# Patient Record
Sex: Male | Born: 1970 | Race: White | Hispanic: No | Marital: Married | Smoking: Never smoker
Health system: Southern US, Community
[De-identification: ages and names within clinical notes are randomized; demographics above are authoritative.]

## PROBLEM LIST (undated history)

## (undated) DIAGNOSIS — F32A Depression, unspecified: Secondary | ICD-10-CM

## (undated) DIAGNOSIS — K219 Gastro-esophageal reflux disease without esophagitis: Secondary | ICD-10-CM

## (undated) DIAGNOSIS — E119 Type 2 diabetes mellitus without complications: Secondary | ICD-10-CM

## (undated) DIAGNOSIS — F329 Major depressive disorder, single episode, unspecified: Secondary | ICD-10-CM

## (undated) DIAGNOSIS — L700 Acne vulgaris: Secondary | ICD-10-CM

## (undated) DIAGNOSIS — G4733 Obstructive sleep apnea (adult) (pediatric): Secondary | ICD-10-CM

## (undated) DIAGNOSIS — E785 Hyperlipidemia, unspecified: Secondary | ICD-10-CM

## (undated) DIAGNOSIS — I10 Essential (primary) hypertension: Secondary | ICD-10-CM

## (undated) DIAGNOSIS — F419 Anxiety disorder, unspecified: Secondary | ICD-10-CM

## (undated) HISTORY — PX: OTHER SURGICAL HISTORY: SHX169

---

## 2007-09-21 ENCOUNTER — Ambulatory Visit: Payer: Self-pay | Admitting: Internal Medicine

## 2007-10-13 ENCOUNTER — Ambulatory Visit: Payer: Self-pay | Admitting: Internal Medicine

## 2010-11-20 ENCOUNTER — Emergency Department: Payer: Self-pay | Admitting: Emergency Medicine

## 2014-01-03 ENCOUNTER — Emergency Department: Payer: Self-pay | Admitting: Student

## 2014-01-03 LAB — DRUG SCREEN, URINE
AMPHETAMINES, UR SCREEN: NEGATIVE (ref ?–1000)
BARBITURATES, UR SCREEN: NEGATIVE (ref ?–200)
BENZODIAZEPINE, UR SCRN: NEGATIVE (ref ?–200)
Cannabinoid 50 Ng, Ur ~~LOC~~: NEGATIVE (ref ?–50)
Cocaine Metabolite,Ur ~~LOC~~: NEGATIVE (ref ?–300)
MDMA (ECSTASY) UR SCREEN: NEGATIVE (ref ?–500)
METHADONE, UR SCREEN: NEGATIVE (ref ?–300)
Opiate, Ur Screen: NEGATIVE (ref ?–300)
Phencyclidine (PCP) Ur S: NEGATIVE (ref ?–25)
Tricyclic, Ur Screen: NEGATIVE (ref ?–1000)

## 2014-01-03 LAB — CBC
HCT: 46.1 % (ref 40.0–52.0)
HGB: 14.9 g/dL (ref 13.0–18.0)
MCH: 28.5 pg (ref 26.0–34.0)
MCHC: 32.3 g/dL (ref 32.0–36.0)
MCV: 88 fL (ref 80–100)
PLATELETS: 205 10*3/uL (ref 150–440)
RBC: 5.21 10*6/uL (ref 4.40–5.90)
RDW: 14.4 % (ref 11.5–14.5)
WBC: 11.8 10*3/uL — ABNORMAL HIGH (ref 3.8–10.6)

## 2014-01-03 LAB — COMPREHENSIVE METABOLIC PANEL
ALBUMIN: 4 g/dL (ref 3.4–5.0)
ANION GAP: 9 (ref 7–16)
AST: 15 U/L (ref 15–37)
Alkaline Phosphatase: 163 U/L — ABNORMAL HIGH
BUN: 6 mg/dL — ABNORMAL LOW (ref 7–18)
Bilirubin,Total: 0.5 mg/dL (ref 0.2–1.0)
CHLORIDE: 104 mmol/L (ref 98–107)
CO2: 28 mmol/L (ref 21–32)
CREATININE: 1.21 mg/dL (ref 0.60–1.30)
Calcium, Total: 8.4 mg/dL — ABNORMAL LOW (ref 8.5–10.1)
EGFR (African American): >60
EGFR (Non-African Amer.): >60
GLUCOSE: 182 mg/dL — AB (ref 65–99)
OSMOLALITY: 284 (ref 275–301)
Potassium: 3.7 mmol/L (ref 3.5–5.1)
SGPT (ALT): 26 U/L
SODIUM: 141 mmol/L (ref 136–145)
Total Protein: 8.2 g/dL (ref 6.4–8.2)

## 2014-01-03 LAB — URINALYSIS, COMPLETE
BACTERIA: NONE SEEN
BILIRUBIN, UR: NEGATIVE
Blood: NEGATIVE
Glucose,UR: NEGATIVE mg/dL (ref 0–75)
Ketone: NEGATIVE
Leukocyte Esterase: NEGATIVE
NITRITE: NEGATIVE
Ph: 6 (ref 4.5–8.0)
Protein: NEGATIVE
RBC,UR: NONE SEEN /HPF (ref 0–5)
Specific Gravity: 1.014 (ref 1.003–1.030)
Squamous Epithelial: NONE SEEN

## 2014-01-03 LAB — ETHANOL: Ethanol: <3 mg/dL

## 2014-01-03 LAB — ACETAMINOPHEN LEVEL: Acetaminophen: <2 ug/mL

## 2014-01-03 LAB — SALICYLATE LEVEL: Salicylates, Serum: <1.7 mg/dL

## 2014-06-22 NOTE — Consult Note (Signed)
PATIENT NAME:  [name] [name], [name] [name] MR#:  [id number] DATE OF BIRTH:  26-May-1970  DATE OF CONSULTATION:  01/03/2014  REFERRING PHYSICIAN:   CONSULTING PHYSICIAN:  Audery AmelJohn T. Hercules Hasler, MD  IDENTIFYING INFORMATION AND REASON FOR CONSULTATION: This is a 44 year old man with a history of depression who was referred voluntarily to come to the Emergency Room for evaluation.   CHIEF COMPLAINT: "I've been feeling worse."   HISTORY OF PRESENT ILLNESS: Information obtained from the patient and the chart as well as A face-to-face conversation I had with his outpatient provider, Dr. Maryruth BunKapur. The patient sees Dr. Maryruth BunKapur regularly for treatment of depression. His symptoms have been getting worse recently. His father died over the summer, which was a terrible blow to him emotionally. Additionally, his mother has cancer. Also, there seems to be some chronic tension in his marriage to some degree. The patient dislikes his job and feels like it is emotionally stressful as well. He has recently found himself crying more. He has less interest in going to work. Feels sleepy a lot during the day. He had made a comment earlier about how he had passive thoughts of just wishing that he would die, but he tells me very clearly that he has not had any plans or intention of doing anything to kill himself. He can articulate a desire to live for the sake of his 44-year-old daughter. Does not feel completely hopeless about the future. He is not having any psychotic symptoms. The patient is taking antidepressant medication and recently there was an addition of Abilify to that, but it is probably too early to know if that is going to be helpful. The patient is requesting that he be released and allowed to go home.   PAST PSYCHIATRIC HISTORY: No history of hospitalizations. No history of suicide attempts. He has been seeing Dr. Maryruth BunKapur for outpatient treatment for his depression. She also sees him as having a chronic anxiety disorder with social  anxiety and probably generalized anxiety as well. She notes to me also that he has been somewhat dependent on his family chronically and was very devoted to his father. The patient has no history of psychosis. No history of substance abuse.   PAST MEDICAL HISTORY: He has diabetes, high cholesterol and high blood pressure.   SOCIAL HISTORY: Married, has a 44-year-old daughter. Stress in the marriage seems to be not terrible but chronic. As mentioned, his father died this summer and his mother has cancer, both of which are very stressful for him. The patient works driving a truck and doing maintenance.   SUBSTANCE ABUSE HISTORY: No alcohol or drug use. No past history of substance abuse.   FAMILY HISTORY: Unknown.   REVIEW OF SYSTEMS: He says that he feels sad and lonely. Mood is down. Tired more than usual. Sleeps excessively. Lost interest in some activities. Denies any hallucinations. Denies any suicidal or homicidal ideation. Physically he is not reporting any physical symptoms other than being somewhat more fatigued chronically.   MENTAL STATUS EXAMINATION: A somewhat disheveled gentleman who looks his stated age, cooperative with the interview. Good eye contact. Normal psychomotor activity. Speech normal rate, tone and volume. He was tearful during part of the interview, but appropriately so and reactive. Mood is stated as being down. Thoughts appear to be lucid. No evidence of loosening of associations or delusions. Denies auditory or visual hallucinations. Denies any suicidal intent or plan. Denies any homicidal ideation. He can recall 3/3 objects immediately and 2/3 at three minutes. Long-term  memory intact. Judgment and insight intact. Normal fund of knowledge.   CURRENT MEDICATIONS: Lexapro 20 mg once a day, Janumet 500/50 one tablet once a day, atorvastatin 40 mg at night, Abilify 5 mg a day, hydroxyzine p.r.n. for anxiety, temazepam 15 mg at night as needed for sleep, nifedipine 30 mg once a  day extended release.   ALLERGIES: PENICILLIN AND SULFA DRUGS.   LABORATORY RESULTS: Blood sugar a little high at 182. Calcium low at 8.4. Alkaline phosphatase elevated at 163. Alcohol level negative. Drug screen entirely negative. CBC: Elevated white count 11.3, otherwise normal. Urinalysis normal, not showing excessive glucose. Acetaminophen and salicylates negative.   VITAL SIGNS: His blood pressure in the Emergency Room currently 173/98, respirations 17, pulse 105, temperature 98.   ASSESSMENT: A 44 year old man with major depression and chronic anxiety with exacerbation recently. He had come to the Emergency Room essentially on his own initiative because he had been feeling worse. After evaluation today, the patient states very much that he wants to go home. He does not feel comfortable with the idea of being admitted to the hospital. He is able to articulate to me as I said a clear desire to continue living and does not have any plan to harm himself. I spoke with his outpatient provider, Dr. Maryruth Bun, who says she is not concerned that he is an active threat to harm himself either. She has planned to see him in her office tomorrow morning and he is agreeable to that. The patient was counseled and given supportive therapy. No change made to medicine. The patient can be released from the Emergency Room.   DIAGNOSIS, PRINCIPAL AND PRIMARY:  AXIS I: Major depression, single, moderate.   SECONDARY DIAGNOSES: AXIS I:  1.  Social anxiety disorder.  2.  Generalized anxiety disorder.  AXIS II: Deferred.  AXIS III: Diabetes, high blood pressure, dyslipidemia. ____________________________ Audery Amel, MD jtc:sb D: 01/03/2014 13:09:08 ET T: 01/03/2014 13:20:41 ET JOB#: [id number]  cc: Audery Amel, MD, <Dictator> Audery Amel MD ELECTRONICALLY SIGNED 01/05/2014 14:57

## 2014-06-22 NOTE — Consult Note (Signed)
Brief Consult Note: Diagnosis: major depression.   Patient was seen by consultant.   Consult note dictated.   Discussed with Attending MD.   Comments: PSychiatry: PAtient seen and chart reviewed and discussed case with outpt provider. PAtient denies any suicidal ideation and wants to go home. Does not meet criteria for commitment. Will suggest discharge from the ER. Follow up Dr Maryruth BunKapur tomorrow.  Electronic Signatures: Audery Amellapacs, Juliett Eastburn T (MD)  (Signed [phone]-Nov-15 13:02)  Authored: Brief Consult Note   Last Updated: 05-Nov-15 13:02 by Audery Amellapacs, Mattew Chriswell T (MD)

## 2014-11-06 ENCOUNTER — Ambulatory Visit: Payer: BC Managed Care – PPO | Attending: Internal Medicine

## 2014-11-06 DIAGNOSIS — G4733 Obstructive sleep apnea (adult) (pediatric): Secondary | ICD-10-CM | POA: Diagnosis present

## 2014-11-28 ENCOUNTER — Ambulatory Visit: Payer: BC Managed Care – PPO | Attending: Specialist

## 2014-11-28 DIAGNOSIS — G4733 Obstructive sleep apnea (adult) (pediatric): Secondary | ICD-10-CM | POA: Insufficient documentation

## 2014-11-28 DIAGNOSIS — G4761 Periodic limb movement disorder: Secondary | ICD-10-CM | POA: Insufficient documentation

## 2015-01-22 ENCOUNTER — Emergency Department
Admission: EM | Admit: 2015-01-22 | Discharge: 2015-01-22 | Disposition: A | Discharge status: Home / Self Care | Payer: BC Managed Care – PPO | Attending: Emergency Medicine | Admitting: Emergency Medicine

## 2015-01-22 ENCOUNTER — Encounter: Payer: Self-pay | Admitting: Emergency Medicine

## 2015-01-22 DIAGNOSIS — A46 Erysipelas: Secondary | ICD-10-CM

## 2015-01-22 DIAGNOSIS — L739 Follicular disorder, unspecified: Secondary | ICD-10-CM | POA: Insufficient documentation

## 2015-01-22 DIAGNOSIS — I10 Essential (primary) hypertension: Secondary | ICD-10-CM | POA: Insufficient documentation

## 2015-01-22 DIAGNOSIS — E119 Type 2 diabetes mellitus without complications: Secondary | ICD-10-CM | POA: Insufficient documentation

## 2015-01-22 DIAGNOSIS — Z880 Allergy status to penicillin: Secondary | ICD-10-CM | POA: Diagnosis not present

## 2015-01-22 DIAGNOSIS — R21 Rash and other nonspecific skin eruption: Secondary | ICD-10-CM | POA: Diagnosis present

## 2015-01-22 HISTORY — DX: Anxiety disorder, unspecified: F41.9

## 2015-01-22 HISTORY — DX: Essential (primary) hypertension: I10

## 2015-01-22 HISTORY — DX: Type 2 diabetes mellitus without complications: E11.9

## 2015-01-22 HISTORY — DX: Depression, unspecified: F32.A

## 2015-01-22 HISTORY — DX: Hyperlipidemia, unspecified: E78.5

## 2015-01-22 HISTORY — DX: Major depressive disorder, single episode, unspecified: F32.9

## 2015-01-22 MED ORDER — CEPHALEXIN 500 MG PO CAPS
500.0000 mg | ORAL_CAPSULE | Freq: Once | ORAL | Status: AC
  Administered 2015-01-22: 500 mg via ORAL
  Filled 2015-01-22: qty 1

## 2015-01-22 MED ORDER — CLINDAMYCIN HCL 150 MG PO CAPS
ORAL_CAPSULE | ORAL | Status: AC
  Administered 2015-01-22: 450 mg via ORAL
  Filled 2015-01-22: qty 3

## 2015-01-22 MED ORDER — CEPHALEXIN 500 MG PO CAPS: 500.0000 mg | ORAL_CAPSULE | Freq: Two times a day (BID) | ORAL | Status: DC

## 2015-01-22 MED ORDER — CLINDAMYCIN HCL 150 MG PO CAPS
450.0000 mg | ORAL_CAPSULE | Freq: Three times a day (TID) | ORAL | Status: DC
  Administered 2015-01-22: 450 mg via ORAL

## 2015-01-22 MED ORDER — CLINDAMYCIN HCL 150 MG PO CAPS: 450.0000 mg | ORAL_CAPSULE | Freq: Three times a day (TID) | ORAL | Status: DC

## 2015-01-22 NOTE — Discharge Instructions (Signed)
Return to the emergency department for any worsening condition including worsening swelling, redness, rash, dizziness, passing out, or any other symptoms concerning to you.  Folliculitis Folliculitis is redness, soreness, and swelling (inflammation) of the hair follicles. This condition can occur anywhere on the body. People with weakened immune systems, diabetes, or obesity have a greater risk of getting folliculitis. CAUSES  Bacterial infection. This is the most common cause.  Fungal infection.  Viral infection.  Contact with certain chemicals, especially oils and tars. Long-term folliculitis can result from bacteria that live in the nostrils. The bacteria may trigger multiple outbreaks of folliculitis over time. SYMPTOMS Folliculitis most commonly occurs on the scalp, thighs, legs, back, buttocks, and areas where hair is shaved frequently. An early sign of folliculitis is a small, white or yellow, pus-filled, itchy lesion (pustule). These lesions appear on a red, inflamed follicle. They are usually less than 0.2 inches (5 mm) wide. When there is an infection of the follicle that goes deeper, it becomes a boil or furuncle. A group of closely packed boils creates a larger lesion (carbuncle). Carbuncles tend to occur in hairy, sweaty areas of the body. DIAGNOSIS  Your caregiver can usually tell what is wrong by doing a physical exam. A sample may be taken from one of the lesions and tested in a lab. This can help determine what is causing your folliculitis. TREATMENT  Treatment may include:  Applying warm compresses to the affected areas.  Taking antibiotic medicines orally or applying them to the skin.  Draining the lesions if they contain a large amount of pus or fluid.  Laser hair removal for cases of long-lasting folliculitis. This helps to prevent regrowth of the hair. HOME CARE INSTRUCTIONS  Apply warm compresses to the affected areas as directed by your caregiver.  If  antibiotics are prescribed, take them as directed. Finish them even if you start to feel better.  You may take over-the-counter medicines to relieve itching.  Do not shave irritated skin.  Follow up with your caregiver as directed. SEEK IMMEDIATE MEDICAL CARE IF:   You have increasing redness, swelling, or pain in the affected area.  You have a fever. MAKE SURE YOU:  Understand these instructions.  Will watch your condition.  Will get help right away if you are not doing well or get worse.   This information is not intended to replace advice given to you by your health care provider. Make sure you discuss any questions you have with your health care provider.   Document Released: 04/26/2001 Document Revised: 03/08/2014 Document Reviewed: 05/18/2011 Elsevier Interactive Patient Education 2016 Elsevier Inc.  Erysipelas Erysipelas is an infection that affects the skin and the tissues that are near the surface of the skin. It causes the skin to become red, swollen, and painful. The infection is most common on the legs but may also affect other areas, such as the face. With treatment, the infection usually goes away in a few days. If not treated, the infection can spread or lead to other problems, such as abscesses. CAUSES Erysipelas is caused by bacteria. Most often, it is caused by bacteria called streptococci. The bacteria often enter through a break in the skin, such as a cut, surgical incision, burn, insect bite, open sore, or crack in the skin. Sometimes the source where the bacteria entered is not known. RISK FACTORS Some people are at an increased risk for developing erysipelas, including:  Young children.  Elderly people.  People with a weakened body defense  system (immune system), such as people with HIV or AIDS.  People who have diabetes.  People who drink too much alcohol.  People who have had recent surgery.  People with yeast infections of the skin.  People who  have swollen legs. SIGNS AND SYMPTOMS The infection causes a reddened area on the skin. This reddened area may:  Be painful and swollen.  Have a distinct border around it.  Feel itchy and hot.  Develop blisters. Other symptoms may include:  Fever.  Chills.  Nausea and vomiting.  Swollen glands (lymph nodes).  Headache.  Fatigue.  Loss of appetite. DIAGNOSIS Your health care provider will take your medical history and do a physical exam. He or she will usually be able to diagnose erysipelas by closely examining your skin. TREATMENT Erysipelas can usually be treated effectively with antibiotic medicines. The infection usually gets better within a few days of treatment. HOME CARE INSTRUCTIONS  Take medicines only as directed by your health care provider.  Take your antibiotic medicine as directed by your health care provider. Finish the antibiotic even if you start to feel better.  If the skin infection is on your leg or arm, elevate the leg or arm to help reduce swelling.  Do not put creams or lotions on the affected area of your skin unless your health care provider instructs you to do that.  Do not share bedding, towels, or washcloths (linens) with other people. Using only your own linens will help to prevent the infection from spreading to others.  Keep all follow-up visits as directed by your health care provider. This is important. SEEK MEDICAL CARE IF:  You have pain or discomfort that is not controlled by medicines.  Your red area of skin gets larger or turns dark in color.  Your skin infection returns in the same area or appears in another area. SEEK IMMEDIATE MEDICAL CARE IF:  Your fever is getting worse.  Your feelings of illness are getting worse.  You notice red streaks coming from the infected area.   This information is not intended to replace advice given to you by your health care provider. Make sure you discuss any questions you have with your  health care provider.   Document Released: 11/10/2000 Document Revised: 03/08/2014 Document Reviewed: 10/01/2013 Elsevier Interactive Patient Education [employer] Elsevier Inc.

## 2015-01-22 NOTE — ED Notes (Signed)
Patient to ER for c/o fever today with swelling to right side of face x1 week. Patient reports fever of 101.8 at home, took Tylenol. Has not had IBU today.

## 2015-01-22 NOTE — ED Provider Notes (Signed)
[facility] Emergency Department Provider Note   ____________________________________________  Time seen:  I have reviewed the triage vital signs and the triage nursing note.  HISTORY  Chief Complaint Facial Swelling and Fever   Historian Patient and wife   HPI [name] [name]. is a 44 y.o. male with a history of acne for which he take a topical lotion, minocycline po and face wash, is here for worsening skin rash and bumps for several days.  He has had many ingrown hairs from facial hair. He's had a fever today to 101. He took Tylenol at home prior to arrival.  Family members have had upper respiratory infections. He is not coughing. He's not having trouble breathing. He is not having abdominal pain, vomiting or diarrhea.  In the past he's had have cortisone injections to areas of inflammation on his face. Apparently this was with the dermatologist. He does have an appointment with his dermatologist on Monday.    Past Medical History  Diagnosis Date  . Diabetes mellitus without complication (HCC)   . Hypertension   . Hyperlipidemia   . Anxiety   . Depression     There are no active problems to display for this patient.   Past Surgical History  Procedure Laterality Date  . Gum graft      Current Outpatient Rx  Name  Route  Sig  Dispense  Refill  . cephALEXin (KEFLEX) 500 MG capsule   Oral   Take 1 capsule (500 mg total) by mouth 2 (two) times daily.   14 capsule   0   . clindamycin (CLEOCIN) 150 MG capsule   Oral   Take 3 capsules (450 mg total) by mouth 3 (three) times daily.   63 capsule   0     Allergies Penicillins; Sulfa antibiotics; and Azithromycin  No family history on file.  Social History Social History  Substance Use Topics  . Smoking status: Never Smoker   . Smokeless tobacco: None  . Alcohol Use: No    Review of Systems  Constitutional: Positive for fever. Eyes: Negative for visual changes. ENT: Negative  for sore throat. Cardiovascular: Negative for chest pain. Respiratory: Negative for shortness of breath. Gastrointestinal: Negative for abdominal pain, vomiting and diarrhea. Genitourinary: Negative for dysuria. Musculoskeletal: Negative for back pain. Skin: Positive for facial bumps and rash. Neurological: Negative for headache. 10 point Review of Systems otherwise negative ____________________________________________   PHYSICAL EXAM:  VITAL SIGNS: ED Triage Vitals  Enc Vitals Group     BP 01/22/15 1855 137/80 mmHg     Pulse Rate 01/22/15 1855 110     Resp 01/22/15 1855 20     Temp 01/22/15 1855 98.9 F (37.2 C)     Temp Source 01/22/15 1855 Oral     SpO2 01/22/15 1855 97 %     Weight 01/22/15 1855 270 lb (122.471 kg)     Height 01/22/15 1855  (1.803 m)     Head Cir --      Peak Flow --      Pain Score 01/22/15 1855 4     Pain Loc --      Pain Edu? --      Excl. in GC? --      Constitutional: Alert and oriented. Well appearing and in no distress. Eyes: Conjunctivae are normal. PERRL. Normal extraocular movements. ENT   Head: Normocephalic and atraumatic. Multiple slash numerous inflamed follicles without abscess along the cheeks, chin, neck, and base  of the scalp. Erythema and induration across the chin and neck.   Nose: No congestion/rhinnorhea.   Mouth/Throat: Mucous membranes are moist.   Neck: No stridor. Cardiovascular/Chest: Normal rate, regular rhythm.  No murmurs, rubs, or gallops. Respiratory: Normal respiratory effort without tachypnea nor retractions. Breath sounds are clear and equal bilaterally. No wheezes/rales/rhonchi. Gastrointestinal: Soft. No distention, no guarding, no rebound. Nontender   Genitourinary/rectal:Deferred Musculoskeletal: Nontender with normal range of motion in all extremities. No joint effusions.  No lower extremity tenderness.  No edema. Neurologic:  Normal speech and language. No gross or focal neurologic deficits  are appreciated. Skin:  Skin is warm and dry. Psychiatric: Mood and affect are normal. Speech and behavior are normal. Patient exhibits appropriate insight and judgment.  ____________________________________________   EKG I, Governor Rooksebecca Fia Hebert, MD, the attending physician have personally viewed and interpreted all ECGs.  No EKG performed ____________________________________________  LABS (pertinent positives/negatives)  None  ____________________________________________  RADIOLOGY All Xrays were viewed by me. Imaging interpreted by Radiologist.  None __________________________________________  PROCEDURES  Procedure(s) performed: None  Critical Care performed: None  ____________________________________________   ED COURSE / ASSESSMENT AND PLAN  CONSULTATIONS: None  Pertinent labs & imaging results that were available during my care of the patient were reviewed by me and considered in my medical decision making (see chart for details).   This patient is overall well-appearing with a folliculitis as well as redness and induration which may have a component of erysipelas. There is no evidence of abscess. The patient reportedly had a fever at home, however he is well-appearing and he requires IV treatment or inpatient treatment.  I'm going to treat him with Keflex and clindamycin. The family understands return precautions. He has an appointment with his dermatologist on Monday.  Patient / Family / Caregiver informed of clinical course, medical decision-making process, and agree with plan.   I discussed return precautions, follow-up instructions, and discharged instructions with patient and/or family.  ___________________________________________   FINAL CLINICAL IMPRESSION(S) / ED DIAGNOSES   Final diagnoses:  Folliculitis  Erysipelas       Governor Rooksebecca Aaren [place], MD 01/22/15 2019

## 2015-01-22 NOTE — ED Notes (Signed)
Pt here with swelling to the right side of his neck. Pt states that has had a fever for a week and that the swelling has increased since then.  Pt is in NAD at this time and pt is maintaining airway well as well as secretion.

## 2016-08-01 ENCOUNTER — Ambulatory Visit
Admission: EM | Admit: 2016-08-01 | Discharge: 2016-08-01 | Disposition: A | Discharge status: Home / Self Care | Payer: BC Managed Care – PPO | Attending: Family Medicine | Admitting: Family Medicine

## 2016-08-01 DIAGNOSIS — H60502 Unspecified acute noninfective otitis externa, left ear: Secondary | ICD-10-CM

## 2016-08-01 HISTORY — DX: Gastro-esophageal reflux disease without esophagitis: K21.9

## 2016-08-01 MED ORDER — CIPROFLOXACIN-HYDROCORTISONE 0.2-1 % OT SUSP: 3.0000 [drp] | Freq: Two times a day (BID) | OTIC | 0 refills | Status: AC

## 2016-08-01 NOTE — ED Provider Notes (Signed)
CSN: [id number]     Arrival date & time 08/01/16  1143 History   First MD Initiated Contact with Patient 08/01/16 1302     Chief Complaint  Patient presents with  . Tinnitus   (Consider location/radiation/quality/duration/timing/severity/associated sxs/prior Treatment) Patient 46 year old male who presents with complaining of pain and ringing in his right ear for about 1 week. Patient also reports decreased hearing. Patient denies fever but states he has had a runny nose. Patient has a right ear issues. Patient denies sore throat and any water contact like swimming. Patient states he does have a history of wax buildup in the past.      Past Medical History:  Diagnosis Date  . Anxiety   . Depression   . Diabetes mellitus without complication (HCC)   . GERD (gastroesophageal reflux disease)   . Hyperlipidemia   . Hypertension    Past Surgical History:  Procedure Laterality Date  . Gum Graft     History reviewed. No pertinent family history. Social History  Substance Use Topics  . Smoking status: Never Smoker  . Smokeless tobacco: Never Used  . Alcohol use No    Review of Systems  Constitutional: Positive for chills. Negative for fever.  HENT: Positive for ear pain and hearing loss.   All other systems reviewed and are negative.   Allergies  Penicillins; Sulfa antibiotics; and Azithromycin  Home Medications   Prior to Admission medications   Medication Sig Start Date End Date Taking? Authorizing Provider  atorvastatin (LIPITOR) 80 MG tablet Take 80 mg by mouth daily.   Yes [provider]  Brexpiprazole (REXULTI) 2 MG TABS Take by mouth.   Yes [provider]  cetirizine (ZYRTEC) 10 MG tablet Take 10 mg by mouth daily.   Yes [provider]  escitalopram (LEXAPRO) 10 MG tablet Take 10 mg by mouth daily.   Yes [provider]  glimepiride (AMARYL) 4 MG tablet Take 4 mg by mouth daily with breakfast.   Yes [provider]   losartan (COZAAR) 50 MG tablet Take 50 mg by mouth daily.   Yes [provider]  metoprolol succinate (TOPROL-XL) 50 MG 24 hr tablet Take 50 mg by mouth daily. Take with or immediately following a meal.   Yes [provider]  NIFEdipine (PROCARDIA-XL/ADALAT CC) 30 MG 24 hr tablet Take 30 mg by mouth daily.   Yes [provider]  omeprazole (PRILOSEC) 40 MG capsule Take 40 mg by mouth daily.   Yes [provider]  sitaGLIPtin-metformin (JANUMET) 50-500 MG tablet Take 1 tablet by mouth 2 (two) times daily with a meal.   Yes [provider]  cephALEXin (KEFLEX) 500 MG capsule Take 1 capsule (500 mg total) by mouth 2 (two) times daily. 01/22/15   Governor Rooks, MD  ciprofloxacin-hydrocortisone (CIPRO [facility]) otic suspension Place 3 drops into the left ear 2 (two) times daily. 08/01/16 08/08/16  Candis Schatz, PA-C  clindamycin (CLEOCIN) 150 MG capsule Take 3 capsules (450 mg total) by mouth 3 (three) times daily. 01/22/15   Governor Rooks, MD   Meds Ordered and Administered this Visit  Medications - No data to display  BP 134/88 (BP Location: Left Arm)   Pulse 76   Temp 98.4 F (36.9 C) (Oral)   Resp 20   Ht 6\' 1"  (1.854 m)   Wt 270 lb (122.5 kg)   SpO2 99%   BMI 35.62 kg/m  No data found.   Physical Exam  Constitutional: He is  oriented to person, place, and time. He appears well-developed and well-nourished.  HENT:  Head: Normocephalic and atraumatic.  Right Ear: Tympanic membrane normal.  Left Ear: External ear normal. There is swelling and tenderness. Decreased hearing is noted.  -Excess cerumen right canal. - tenderness to pulling and palpation of the external left ear. L canal tenderness, edema, and exudate.  Eyes: EOM are normal.  Neck: Normal range of motion. Neck supple.  Cardiovascular: Normal rate and regular rhythm.   Pulmonary/Chest: Effort normal and breath sounds normal.  Musculoskeletal: Normal range of motion.   Lymphadenopathy:    He has no cervical adenopathy.  Neurological: He is oriented to person, place, and time.    Urgent Care Course     Procedures (including critical care time)  Labs Review Labs Reviewed - No data to display  Imaging Review No results found.    MDM   1. Acute otitis externa of left ear, unspecified type    Patient with left ear pain and decreased hearing. Redness, swelling, and exudate noted. Difficult to visualize TM due to swelling. Give patient prescription for Cipro eardrops. Recommend return to clinic or PCP should symptoms worsen or not improve. Patient verbalized understanding and is in agreement with plan.  Candis SchatzMichael D Mixtli Reno, PA-C     Candis SchatzHarris, Lyrik Dockstader D, PA-C 08/01/16 1323

## 2016-08-01 NOTE — ED Triage Notes (Signed)
Pt with left ear ringing and hurts to touch x past week. Pain 7/10

## 2016-08-01 NOTE — Discharge Instructions (Signed)
-  Cipro ear drops. Three drops twice a day for 7 days -no swimming or head immersion -Tylenol or ibuprofen for pain Follow-up with clinic or primary care provider should symptoms worsen or not improve.

## 2017-08-04 ENCOUNTER — Encounter: Payer: Self-pay | Admitting: Emergency Medicine

## 2017-08-04 ENCOUNTER — Emergency Department: Payer: Medicare Other

## 2017-08-04 ENCOUNTER — Inpatient Hospital Stay
Admission: EM | Admit: 2017-08-04 | Discharge: 2017-08-07 | DRG: 641 | Disposition: A | Discharge status: Home / Self Care | Payer: Medicare Other | Attending: Internal Medicine | Admitting: Internal Medicine

## 2017-08-04 ENCOUNTER — Other Ambulatory Visit: Payer: Self-pay

## 2017-08-04 DIAGNOSIS — Z6832 Body mass index (BMI) 32.0-32.9, adult: Secondary | ICD-10-CM

## 2017-08-04 DIAGNOSIS — F329 Major depressive disorder, single episode, unspecified: Secondary | ICD-10-CM | POA: Diagnosis present

## 2017-08-04 DIAGNOSIS — D72829 Elevated white blood cell count, unspecified: Secondary | ICD-10-CM | POA: Diagnosis not present

## 2017-08-04 DIAGNOSIS — M62838 Other muscle spasm: Secondary | ICD-10-CM | POA: Diagnosis present

## 2017-08-04 DIAGNOSIS — T380X5A Adverse effect of glucocorticoids and synthetic analogues, initial encounter: Secondary | ICD-10-CM | POA: Diagnosis not present

## 2017-08-04 DIAGNOSIS — J449 Chronic obstructive pulmonary disease, unspecified: Secondary | ICD-10-CM | POA: Diagnosis present

## 2017-08-04 DIAGNOSIS — Z881 Allergy status to other antibiotic agents status: Secondary | ICD-10-CM

## 2017-08-04 DIAGNOSIS — E874 Mixed disorder of acid-base balance: Secondary | ICD-10-CM | POA: Diagnosis present

## 2017-08-04 DIAGNOSIS — R059 Cough, unspecified: Secondary | ICD-10-CM

## 2017-08-04 DIAGNOSIS — F419 Anxiety disorder, unspecified: Secondary | ICD-10-CM | POA: Diagnosis present

## 2017-08-04 DIAGNOSIS — E662 Morbid (severe) obesity with alveolar hypoventilation: Secondary | ICD-10-CM | POA: Diagnosis present

## 2017-08-04 DIAGNOSIS — Z7984 Long term (current) use of oral hypoglycemic drugs: Secondary | ICD-10-CM

## 2017-08-04 DIAGNOSIS — Z882 Allergy status to sulfonamides status: Secondary | ICD-10-CM | POA: Diagnosis not present

## 2017-08-04 DIAGNOSIS — I10 Essential (primary) hypertension: Secondary | ICD-10-CM | POA: Diagnosis present

## 2017-08-04 DIAGNOSIS — R809 Proteinuria, unspecified: Secondary | ICD-10-CM | POA: Diagnosis not present

## 2017-08-04 DIAGNOSIS — R05 Cough: Secondary | ICD-10-CM | POA: Diagnosis present

## 2017-08-04 DIAGNOSIS — R402413 Glasgow coma scale score 13-15, at hospital admission: Secondary | ICD-10-CM | POA: Diagnosis present

## 2017-08-04 DIAGNOSIS — E119 Type 2 diabetes mellitus without complications: Secondary | ICD-10-CM | POA: Diagnosis present

## 2017-08-04 DIAGNOSIS — E876 Hypokalemia: Secondary | ICD-10-CM | POA: Diagnosis present

## 2017-08-04 DIAGNOSIS — R4701 Aphasia: Secondary | ICD-10-CM | POA: Diagnosis present

## 2017-08-04 DIAGNOSIS — K219 Gastro-esophageal reflux disease without esophagitis: Secondary | ICD-10-CM | POA: Diagnosis present

## 2017-08-04 DIAGNOSIS — R4781 Slurred speech: Secondary | ICD-10-CM | POA: Diagnosis present

## 2017-08-04 DIAGNOSIS — Z880 Allergy status to penicillin: Secondary | ICD-10-CM | POA: Diagnosis not present

## 2017-08-04 DIAGNOSIS — E785 Hyperlipidemia, unspecified: Secondary | ICD-10-CM | POA: Diagnosis present

## 2017-08-04 DIAGNOSIS — Z79899 Other long term (current) drug therapy: Secondary | ICD-10-CM | POA: Diagnosis not present

## 2017-08-04 HISTORY — DX: Acne vulgaris: L70.0

## 2017-08-04 HISTORY — DX: Obstructive sleep apnea (adult) (pediatric): G47.33

## 2017-08-04 LAB — CBC WITH DIFFERENTIAL/PLATELET
Basophils Absolute: 0.1 10*3/uL (ref 0–0.1)
Basophils Relative: 0 %
EOS PCT: 1 %
Eosinophils Absolute: 0.1 10*3/uL (ref 0–0.7)
HCT: 37.9 % — ABNORMAL LOW (ref 40.0–52.0)
Hemoglobin: 12.4 g/dL — ABNORMAL LOW (ref 13.0–18.0)
LYMPHS ABS: 1.5 10*3/uL (ref 1.0–3.6)
LYMPHS PCT: 10 %
MCH: 26.6 pg (ref 26.0–34.0)
MCHC: 32.8 g/dL (ref 32.0–36.0)
MCV: 81.2 fL (ref 80.0–100.0)
MONO ABS: 0.7 10*3/uL (ref 0.2–1.0)
MONOS PCT: 5 %
Neutro Abs: 13.4 10*3/uL — ABNORMAL HIGH (ref 1.4–6.5)
Neutrophils Relative %: 84 %
PLATELETS: 338 10*3/uL (ref 150–440)
RBC: 4.67 MIL/uL (ref 4.40–5.90)
RDW: 15.9 % — ABNORMAL HIGH (ref 11.5–14.5)
WBC: 15.9 10*3/uL — AB (ref 3.8–10.6)

## 2017-08-04 LAB — TSH: TSH: 2.865 u[IU]/mL (ref 0.350–4.500)

## 2017-08-04 LAB — TROPONIN I

## 2017-08-04 LAB — URINALYSIS, COMPLETE (UACMP) WITH MICROSCOPIC
Bacteria, UA: NONE SEEN
Bilirubin Urine: NEGATIVE
Glucose, UA: NEGATIVE mg/dL
Hgb urine dipstick: NEGATIVE
Ketones, ur: NEGATIVE mg/dL
Leukocytes, UA: NEGATIVE
Nitrite: NEGATIVE
PH: 6 (ref 5.0–8.0)
Protein, ur: 30 mg/dL — AB
Specific Gravity, Urine: 1.024 (ref 1.005–1.030)
Squamous Epithelial / LPF: NONE SEEN (ref 0–5)

## 2017-08-04 LAB — COMPREHENSIVE METABOLIC PANEL
ALT: 11 U/L — AB (ref 17–63)
ANION GAP: 18 — AB (ref 5–15)
AST: 30 U/L (ref 15–41)
Albumin: 3.4 g/dL — ABNORMAL LOW (ref 3.5–5.0)
Alkaline Phosphatase: 106 U/L (ref 38–126)
BUN: 10 mg/dL (ref 6–20)
CALCIUM: 6 mg/dL — AB (ref 8.9–10.3)
CHLORIDE: 87 mmol/L — AB (ref 101–111)
CO2: 36 mmol/L — ABNORMAL HIGH (ref 22–32)
CREATININE: 1.04 mg/dL (ref 0.61–1.24)
Glucose, Bld: 143 mg/dL — ABNORMAL HIGH (ref 65–99)
Potassium: 2.5 mmol/L — CL (ref 3.5–5.1)
Sodium: 141 mmol/L (ref 135–145)
Total Bilirubin: 0.5 mg/dL (ref 0.3–1.2)
Total Protein: 7.4 g/dL (ref 6.5–8.1)

## 2017-08-04 LAB — BASIC METABOLIC PANEL
Anion gap: 17 — ABNORMAL HIGH (ref 5–15)
BUN: 9 mg/dL (ref 6–20)
CHLORIDE: 91 mmol/L — AB (ref 101–111)
CO2: 34 mmol/L — ABNORMAL HIGH (ref 22–32)
Calcium: 5.9 mg/dL — CL (ref 8.9–10.3)
Creatinine, Ser: 0.96 mg/dL (ref 0.61–1.24)
GFR calc Af Amer: >60 mL/min (ref >60)
GLUCOSE: 85 mg/dL (ref 65–99)
POTASSIUM: 2.3 mmol/L — AB (ref 3.5–5.1)
SODIUM: 142 mmol/L (ref 135–145)

## 2017-08-04 LAB — MAGNESIUM: Magnesium: 1.6 mg/dL — ABNORMAL LOW (ref 1.7–2.4)

## 2017-08-04 LAB — HEMOGLOBIN A1C
Hgb A1c MFr Bld: 6.5 % — ABNORMAL HIGH (ref 4.8–5.6)
Mean Plasma Glucose: 139.85 mg/dL

## 2017-08-04 MED ORDER — POTASSIUM CHLORIDE CRYS ER 20 MEQ PO TBCR
40.0000 meq | EXTENDED_RELEASE_TABLET | ORAL | Status: AC
  Administered 2017-08-04 (×2): 40 meq via ORAL
  Filled 2017-08-04 (×2): qty 2

## 2017-08-04 MED ORDER — ACETAMINOPHEN 325 MG PO TABS: 650.0000 mg | ORAL_TABLET | Freq: Four times a day (QID) | ORAL | Status: DC | PRN

## 2017-08-04 MED ORDER — ACETAMINOPHEN 650 MG RE SUPP: 650.0000 mg | Freq: Four times a day (QID) | RECTAL | Status: DC | PRN

## 2017-08-04 MED ORDER — LORATADINE 10 MG PO TABS
10.0000 mg | ORAL_TABLET | Freq: Every day | ORAL | Status: DC
  Administered 2017-08-05 – 2017-08-07 (×3): 10 mg via ORAL
  Filled 2017-08-04 (×3): qty 1

## 2017-08-04 MED ORDER — CALCIUM CARBONATE 1250 (500 CA) MG PO TABS
1.0000 | ORAL_TABLET | Freq: Two times a day (BID) | ORAL | Status: DC
  Administered 2017-08-04 – 2017-08-07 (×6): 500 mg via ORAL
  Filled 2017-08-04 (×7): qty 1

## 2017-08-04 MED ORDER — METOPROLOL SUCCINATE ER 50 MG PO TB24
50.0000 mg | ORAL_TABLET | Freq: Every day | ORAL | Status: DC
  Administered 2017-08-05 – 2017-08-07 (×3): 50 mg via ORAL
  Filled 2017-08-04 (×3): qty 1

## 2017-08-04 MED ORDER — PANTOPRAZOLE SODIUM 40 MG PO TBEC
40.0000 mg | DELAYED_RELEASE_TABLET | Freq: Every day | ORAL | Status: DC
  Administered 2017-08-05 – 2017-08-07 (×3): 40 mg via ORAL
  Filled 2017-08-04 (×3): qty 1

## 2017-08-04 MED ORDER — GLIMEPIRIDE 2 MG PO TABS
4.0000 mg | ORAL_TABLET | Freq: Every day | ORAL | Status: DC
  Administered 2017-08-05 – 2017-08-07 (×3): 4 mg via ORAL
  Filled 2017-08-04: qty 1
  Filled 2017-08-04 (×2): qty 2
  Filled 2017-08-04: qty 1
  Filled 2017-08-04: qty 2

## 2017-08-04 MED ORDER — SITAGLIPTIN PHOS-METFORMIN HCL 50-500 MG PO TABS: 1.0000 | ORAL_TABLET | Freq: Two times a day (BID) | ORAL | Status: DC

## 2017-08-04 MED ORDER — MAGNESIUM SULFATE 2 GM/50ML IV SOLN
2.0000 g | Freq: Once | INTRAVENOUS | Status: AC
  Administered 2017-08-04: 2 g via INTRAVENOUS
  Filled 2017-08-04: qty 50

## 2017-08-04 MED ORDER — ONDANSETRON HCL 4 MG PO TABS
4.0000 mg | ORAL_TABLET | Freq: Four times a day (QID) | ORAL | Status: DC | PRN
  Administered 2017-08-05: 4 mg via ORAL
  Filled 2017-08-04: qty 1

## 2017-08-04 MED ORDER — BREXPIPRAZOLE 1 MG PO TABS
2.0000 mg | ORAL_TABLET | Freq: Every day | ORAL | Status: DC
  Administered 2017-08-05 – 2017-08-06 (×2): 2 mg via ORAL
  Filled 2017-08-04 (×3): qty 2

## 2017-08-04 MED ORDER — INSULIN ASPART 100 UNIT/ML ~~LOC~~ SOLN: 0.0000 [IU] | Freq: Three times a day (TID) | SUBCUTANEOUS | Status: DC

## 2017-08-04 MED ORDER — POTASSIUM CHLORIDE IN NACL 40-0.9 MEQ/L-% IV SOLN
INTRAVENOUS | Status: DC
  Administered 2017-08-04: 75 mL/h via INTRAVENOUS
  Filled 2017-08-04 (×3): qty 1000

## 2017-08-04 MED ORDER — ONDANSETRON HCL 4 MG/2ML IJ SOLN: 4.0000 mg | Freq: Four times a day (QID) | INTRAMUSCULAR | Status: DC | PRN

## 2017-08-04 MED ORDER — ESCITALOPRAM OXALATE 10 MG PO TABS
10.0000 mg | ORAL_TABLET | Freq: Every day | ORAL | Status: DC
  Administered 2017-08-05 – 2017-08-07 (×3): 10 mg via ORAL
  Filled 2017-08-04 (×4): qty 1

## 2017-08-04 MED ORDER — KCL IN DEXTROSE-NACL 40-5-0.45 MEQ/L-%-% IV SOLN
INTRAVENOUS | Status: DC
  Filled 2017-08-04 (×6): qty 1000

## 2017-08-04 MED ORDER — NIFEDIPINE ER OSMOTIC RELEASE 30 MG PO TB24
30.0000 mg | ORAL_TABLET | Freq: Every day | ORAL | Status: DC
  Administered 2017-08-04 – 2017-08-06 (×3): 30 mg via ORAL
  Filled 2017-08-04 (×5): qty 1

## 2017-08-04 MED ORDER — ISOTRETINOIN 20 MG PO CAPS: 20.0000 mg | ORAL_CAPSULE | Freq: Every day | ORAL | Status: DC

## 2017-08-04 MED ORDER — INSULIN ASPART 100 UNIT/ML ~~LOC~~ SOLN: 0.0000 [IU] | Freq: Every day | SUBCUTANEOUS | Status: DC

## 2017-08-04 MED ORDER — POTASSIUM CHLORIDE 10 MEQ/100ML IV SOLN
10.0000 meq | INTRAVENOUS | Status: AC
  Administered 2017-08-04 – 2017-08-05 (×4): 10 meq via INTRAVENOUS
  Filled 2017-08-04 (×4): qty 100

## 2017-08-04 MED ORDER — POTASSIUM CHLORIDE CRYS ER 20 MEQ PO TBCR
20.0000 meq | EXTENDED_RELEASE_TABLET | Freq: Every day | ORAL | Status: AC
  Administered 2017-08-05 – 2017-08-06 (×2): 20 meq via ORAL
  Filled 2017-08-04 (×2): qty 1

## 2017-08-04 MED ORDER — ATORVASTATIN CALCIUM 20 MG PO TABS
80.0000 mg | ORAL_TABLET | Freq: Every day | ORAL | Status: DC
Start: 2017-08-04 — End: 2017-08-07
  Administered 2017-08-04 – 2017-08-06 (×3): 80 mg via ORAL
  Filled 2017-08-04 (×3): qty 4

## 2017-08-04 MED ORDER — LOSARTAN POTASSIUM 50 MG PO TABS
50.0000 mg | ORAL_TABLET | Freq: Every day | ORAL | Status: DC
  Administered 2017-08-04 – 2017-08-06 (×3): 50 mg via ORAL
  Filled 2017-08-04 (×3): qty 1

## 2017-08-04 MED ORDER — DOCUSATE SODIUM 100 MG PO CAPS
100.0000 mg | ORAL_CAPSULE | Freq: Two times a day (BID) | ORAL | Status: DC
  Administered 2017-08-04 – 2017-08-07 (×6): 100 mg via ORAL
  Filled 2017-08-04 (×6): qty 1

## 2017-08-04 MED ORDER — ALBUTEROL SULFATE (2.5 MG/3ML) 0.083% IN NEBU: 2.5000 mg | INHALATION_SOLUTION | Freq: Four times a day (QID) | RESPIRATORY_TRACT | Status: DC | PRN

## 2017-08-04 MED ORDER — SODIUM CHLORIDE 0.9 % IV SOLN
Freq: Once | INTRAVENOUS | Status: AC
  Administered 2017-08-04: 15:00:00 via INTRAVENOUS

## 2017-08-04 MED ORDER — LINAGLIPTIN 5 MG PO TABS
5.0000 mg | ORAL_TABLET | Freq: Two times a day (BID) | ORAL | Status: DC
  Administered 2017-08-05 – 2017-08-07 (×5): 5 mg via ORAL
  Filled 2017-08-04 (×5): qty 1

## 2017-08-04 MED ORDER — ENOXAPARIN SODIUM 40 MG/0.4ML ~~LOC~~ SOLN
40.0000 mg | SUBCUTANEOUS | Status: DC
  Administered 2017-08-04 – 2017-08-06 (×3): 40 mg via SUBCUTANEOUS
  Filled 2017-08-04 (×3): qty 0.4

## 2017-08-04 MED ORDER — METFORMIN HCL 500 MG PO TABS
500.0000 mg | ORAL_TABLET | Freq: Two times a day (BID) | ORAL | Status: DC
  Administered 2017-08-05 – 2017-08-07 (×5): 500 mg via ORAL
  Filled 2017-08-04 (×5): qty 1

## 2017-08-04 MED ORDER — CALCIUM GLUCONATE 10 % IV SOLN
1.0000 g | Freq: Once | INTRAVENOUS | Status: AC
  Administered 2017-08-04: 1 g via INTRAVENOUS
  Filled 2017-08-04 (×2): qty 10

## 2017-08-04 MED ORDER — LEVOFLOXACIN 500 MG PO TABS
500.0000 mg | ORAL_TABLET | Freq: Every day | ORAL | Status: AC
  Administered 2017-08-04: 500 mg via ORAL
  Filled 2017-08-04: qty 1

## 2017-08-04 MED ORDER — SODIUM CHLORIDE 0.9 % IV SOLN
1.0000 g | Freq: Once | INTRAVENOUS | Status: AC
  Administered 2017-08-04: 1 g via INTRAVENOUS
  Filled 2017-08-04: qty 10

## 2017-08-04 NOTE — ED Provider Notes (Signed)
[name] Emergency Department Provider Note       Time seen: ----------------------------------------- 2:51 PM on 08/04/2017 -----------------------------------------   I have reviewed the triage vital signs [name] the nursing notes.  HISTORY   Chief Complaint Aphasia   HPI [name] CroakWilliam O Orzechowski Jr. is a 47 y.o. male with a history of [name], [name] [name], [name] [name], [name] [name], [name] [name] hypertension who presents to the ED for slurred speech [name] diaphoresis.  According to EMS his hands were contracted [name] his speech is slurred.  Patient states having trouble chewing.  Patient states that several of these events over the last week.  He was noted to be alert [name] oriented on arrival.  He states he is taking antibiotics [name] steroids for bronchitis at this time.  Past Medical History:  Diagnosis Date  . [name]   . [name] [name]   . [name] [name] mellitus without complication (HCC)   . [name] [name] (gastroesophageal reflux disease)   . [name]   . Hypertension     There are no active problems to display for this patient.   Past Surgical History:  Procedure Laterality Date  . Gum Graft      Allergies Penicillins; Sulfa antibiotics; Sulfasalazine; [name] Azithromycin  Social History Social History   Tobacco Use  . Smoking status: Never Smoker  . Smokeless tobacco: Never Used  Substance Use Topics  . Alcohol use: No  . Drug use: No   Review of Systems Constitutional: Negative for fever. Cardiovascular: Negative for chest pain. Respiratory: Negative for shortness of breath. Gastrointestinal: Negative for abdominal pain, vomiting [name] diarrhea. Musculoskeletal: Negative for back pain.  Spasm of the hands Skin: Positive for diaphoresis Neurological: Positive for slurred speech  All systems negative/normal/unremarkable except as stated in the HPI  ____________________________________________   PHYSICAL EXAM:  VITAL SIGNS: ED Triage Vitals  Enc Vitals  Group     BP 08/04/17 1450 123/76     Pulse Rate 08/04/17 1450 80     Resp 08/04/17 1450 (!) 21     Temp 08/04/17 1450 99 F (37.2 C)     Temp Source 08/04/17 1450 Oral     SpO2 08/04/17 1450 95 %     Weight 08/04/17 1442 270 lb (122.5 kg)     Height 08/04/17 1442 6\' 1"  (1.854 m)     Head Circumference --      Peak Flow --      Pain Score 08/04/17 1442 10     Pain Loc --      Pain Edu? --      Excl. in GC? --    Constitutional: Alert [name] oriented.  Mild to moderate distress Eyes: Conjunctivae are normal. Normal extraocular movements. ENT   Head: Normocephalic [name] atraumatic.   Nose: No congestion/rhinnorhea.   Mouth/Throat: Mucous membranes are moist.  Tongue is markedly erythematous   Neck: No stridor. Cardiovascular: Normal rate, regular rhythm. No murmurs, rubs, or gallops. Respiratory: Normal respiratory effort without tachypnea nor retractions. Breath sounds are clear [name] equal bilaterally. No wheezes/rales/rhonchi. Gastrointestinal: Soft [name] nontender. Normal bowel sounds Musculoskeletal: Nontender with normal range of motion in extremities. No lower extremity tenderness nor edema. Neurologic: Somewhat garbled speech, carpopedal spasm is noted, positive for Chvostek sign Skin: Skin is flushed in his face [name] arms with profuse diaphoresis Psychiatric: Mood [name] affect are normal.  Garbled speech ____________________________________________  EKG: Interpreted by me, sinus rhythm rate 81 bpm, LVH, long QT, leftward axis  ____________________________________________  ED COURSE:  As part of my medical decision  making, I reviewed the following data within the electronic MEDICAL RECORD NUMBER History obtained from family if available, nursing notes, old chart [name] ekg, as well as notes from prior ED visits. Patient presented for slurred speech [name] diaphoresis with concerns for electrolyte abnormality or dehydration, we will assess with labs [name] imaging as indicated at this  time. Clinical Course as of Aug 04 1537  Thu Aug 04, 2017  1533 Corrected calcium is 6.5   [JW]    Clinical Course User Index [JW] Emily Filbert, MD   Procedures ____________________________________________   LABS (pertinent positives/negatives)  Labs Reviewed  CBC WITH DIFFERENTIAL/PLATELET - Abnormal; Notable for the following components:      Result Value   WBC 15.9 (*)    Hemoglobin 12.4 (*)    HCT 37.9 (*)    RDW 15.9 (*)    Neutro Abs 13.4 (*)    All other components within normal limits  COMPREHENSIVE METABOLIC PANEL - Abnormal; Notable for the following components:   Potassium 2.5 (*)    Chloride 87 (*)    CO2 36 (*)    Glucose, Bld 143 (*)    Calcium 6.0 (*)    Albumin 3.4 (*)    ALT 11 (*)    Anion gap 18 (*)    All other components within normal limits  TROPONIN I  URINALYSIS, COMPLETE (UACMP) WITH MICROSCOPIC  CBG MONITORING, ED   CRITICAL CARE Performed by: Ulice Dash   Total critical care time: 30 minutes  Critical care time was exclusive of separately billable procedures [name] treating other patients.  Critical care was necessary to treat or prevent imminent or life-threatening deterioration.  Critical care was time spent personally by me on the following activities: development of treatment plan with patient [name]/or surrogate as well as nursing, discussions with consultants, evaluation of patient's response to treatment, examination of patient, obtaining history from patient or surrogate, ordering [name] performing treatments [name] interventions, ordering [name] review of laboratory studies, ordering [name] review of radiographic studies, pulse oximetry [name] re-evaluation of patient's condition.   RADIOLOGY Images were viewed by me CT head IMPRESSION: Bilateral ethmoid [name] maxillary sinusitis. No acute intracranial abnormality seen.   ____________________________________________  DIFFERENTIAL DIAGNOSIS   Dehydration, electrolyte  abnormality, hypocalcemia specifically, allergic reaction, anaphylaxis, CVA unlikely  FINAL ASSESSMENT [name] PLAN  Severe hypocalcemia, hypokalemia   Plan: The patient had presented for slurred speech [name] diaphoresis. Patient's labs did reveal severe hypocalcemia [name] moderate hypokalemia.  He also likely has chronic CO2 retention or obesity hypoventilation. Patient's imaging does not reveal any acute intracranial abnormality.  Most of his symptoms may simply be electrolyte deficiency.  We have started him on 2 g of IV calcium as well as D5 half-normal saline with 40 mg once of potassium.  Patient will require admission.   Ulice Dash, MD   Note: This note was generated in part or whole with voice recognition software. Voice recognition is usually quite accurate but there are transcription errors that can [name] very often do occur. I apologize for any typographical errors that were not detected [name] corrected.     Emily Filbert, MD 08/04/17 [id number]

## 2017-08-04 NOTE — ED Notes (Signed)
Pt states that today he has experienced redness over his body and his arms became contracted. Pt unable to hold cup at this time. Pt also states that when the BP cuff pumps up it causes his arm to turn bright red and becomes stiff.

## 2017-08-04 NOTE — ED Notes (Signed)
Patient transported to CT 

## 2017-08-04 NOTE — ED Triage Notes (Signed)
Pt presents to ED via [facility] with c/o slurred speech and diaphoresis. Per EMS upon their arrival pt's hands were contracted and speech was slurred, somewhat resolved on arrival to ED. Pt hands noted to be contracted on arrival, mildly slurred speech. Pt states last known well was approx 1100am 08/04/17. EMS reports hx of anxiety and depression.

## 2017-08-04 NOTE — ED Notes (Signed)
Pt back from CT

## 2017-08-04 NOTE — Progress Notes (Signed)
Placed patient on cpap.setting of 14 per home setting per patient. Tolerating well

## 2017-08-04 NOTE — ED Notes (Signed)
Family updated on room changed to 233

## 2017-08-04 NOTE — ED Notes (Signed)
Pt given urinal for urine collection 

## 2017-08-04 NOTE — H&P (Signed)
[employer] [facility] [place] [facility]   PATIENT NAME: [name] [name]    MR#:  [id number]  DATE OF BIRTH:  [birth year]  DATE OF ADMISSION:  08/04/2017  PRIMARY CARE PHYSICIAN: Marguarite Arbour, MD   REQUESTING/REFERRING PHYSICIAN: Dr. Sharyn Creamer  CHIEF COMPLAINT:   Chief Complaint  Patient presents with  . Aphasia    HISTORY OF PRESENT ILLNESS:  [name] [name]  is a 47 y.o. male with a known history of hypertension, diabetes, sleep apnea, acne vulgaris, depression and anxiety brought to hospital secondary to slurred speech and muscle spasms. Patient is a poor historian.  Most of the history obtained from his wife at bedside.  He has been having bronchitis symptoms for which she was started on Levaquin and prednisone recently.  His appetite has decreased significantly and he has been having significant nausea and vo                miting episodes.  Last week he had some spasms in both his upper arms which resolved on its own.  Today the family had noted some change in his speech, patient complaining of significant muscle spasms in his hands were contracted and EMS was called.  Labs indicate a potassium of 2.3 and a calcium of 6.  His labs in the past, last ones from March 2019 showing a calcium of 8.4.  He is not on any diuretics at home.  No recent changes to any of his medications.  PAST MEDICAL HISTORY:   Past Medical History:  Diagnosis Date  . Acne vulgaris   . Anxiety   . Depression   . Diabetes mellitus without complication (HCC)   . GERD (gastroesophageal reflux disease)   . Hyperlipidemia   . Hypertension   . OSA (obstructive sleep apnea)     PAST SURGICAL HISTORY:   Past Surgical History:  Procedure Laterality Date  . Gum Graft      SOCIAL HISTORY:   Social History   Tobacco Use  . Smoking status: Never Smoker  . Smokeless tobacco: Never Used  Substance Use Topics  . Alcohol use: No    FAMILY HISTORY:   Family History  Problem  Relation Age of Onset  . Colon cancer Mother   . CAD Father   . Diabetes Sister     DRUG ALLERGIES:   Allergies  Allergen Reactions  . Penicillins Anaphylaxis    Other reaction(s): Unknown  . Sulfa Antibiotics Anaphylaxis    Other reaction(s): Unknown  . Sulfasalazine Anaphylaxis  . Azithromycin Nausea And Vomiting and Rash    REVIEW OF SYSTEMS:   Review of Systems  Constitutional: Positive for malaise/fatigue. Negative for chills, fever and weight loss.  HENT: Negative for ear discharge, ear pain, hearing loss and nosebleeds.   Eyes: Negative for blurred vision, double vision and photophobia.  Respiratory: Positive for cough. Negative for hemoptysis, shortness of breath and wheezing.   Cardiovascular: Negative for chest pain, palpitations, orthopnea and leg swelling.  Gastrointestinal: Positive for nausea. Negative for abdominal pain, constipation, diarrhea, heartburn, melena and vomiting.  Genitourinary: Negative for dysuria, frequency, hematuria and urgency.  Musculoskeletal: Positive for myalgias. Negative for back pain and neck pain.       Muscle contractures  Skin: Negative for rash.  Neurological: Negative for dizziness, tingling, tremors, sensory change, speech change, focal weakness and headaches.  Endo/Heme/Allergies: Does not bruise/bleed easily.  Psychiatric/Behavioral: Negative for depression.    MEDICATIONS AT HOME:   Prior to Admission medications  Medication Sig Start Date End Date Taking? Authorizing Provider  atorvastatin (LIPITOR) 80 MG tablet Take 80 mg by mouth daily.    [provider]  Brexpiprazole (REXULTI) 2 MG TABS Take by mouth.    [provider]  cetirizine (ZYRTEC) 10 MG tablet Take 10 mg by mouth daily.    [provider]  escitalopram (LEXAPRO) 10 MG tablet Take 10 mg by mouth daily.    [provider]  glimepiride (AMARYL) 4 MG tablet Take 4 mg by mouth daily with breakfast.    [provider]    losartan (COZAAR) 50 MG tablet Take 50 mg by mouth daily.    [provider]  metoprolol succinate (TOPROL-XL) 50 MG 24 hr tablet Take 50 mg by mouth daily. Take with or immediately following a meal.    [provider]  NIFEdipine (PROCARDIA-XL/ADALAT CC) 30 MG 24 hr tablet Take 30 mg by mouth daily.    [provider]  omeprazole (PRILOSEC) 40 MG capsule Take 40 mg by mouth daily.    [provider]  sitaGLIPtin-metformin (JANUMET) 50-500 MG tablet Take 1 tablet by mouth 2 (two) times daily with a meal.    [provider]      VITAL SIGNS:  Blood pressure (!) 150/84, pulse 82, temperature 99 F (37.2 C), temperature source Oral, resp. rate 16, height 6\' 1"  (1.854 m), weight 122.5 kg (270 lb), SpO2 95 %.  PHYSICAL EXAMINATION:   Physical Exam  GENERAL:  47 y.o.-year-old obese patient lying in the bed with no acute distress.  EYES: Pupils equal, round, reactive to light and accommodation. No scleral icterus. Extraocular muscles intact.  HEENT: Head atraumatic, normocephalic. Significant facial erythema noted and also rhinophyma.  Oropharynx and nasopharynx clear.  NECK:  Supple, no jugular venous distention. No thyroid enlargement, no tenderness.  LUNGS: Normal breath sounds bilaterally, no wheezing, rales,rhonchi or crepitation. No use of accessory muscles of respiration. Decreased bibasilar breath sounds CARDIOVASCULAR: S1, S2 normal. No murmurs, rubs, or gallops.  ABDOMEN: Soft, nontender, nondistended. Bowel sounds present. No organomegaly or mass.  EXTREMITIES: No pedal edema, cyanosis, or clubbing. Carpopedal spasm with BP cuff inflating noted. Fingers are stiff NEUROLOGIC: Cranial nerves II through XII are intact. Muscle strength 5/5 in all extremities. Sensation intact. Gait not checked.  PSYCHIATRIC: The patient is alert and oriented x 3.  SKIN: No obvious rash, lesion, or ulcer.   LABORATORY PANEL:   CBC Recent Labs  Lab  08/04/17 1456  WBC 15.9*  HGB 12.4*  HCT 37.9*  PLT 338   ------------------------------------------------------------------------------------------------------------------  Chemistries  Recent Labs  Lab 08/04/17 1456  NA 141  K 2.5*  CL 87*  CO2 36*  GLUCOSE 143*  BUN 10  CREATININE 1.04  CALCIUM 6.0*  AST 30  ALT 11*  ALKPHOS 106  BILITOT 0.5   ------------------------------------------------------------------------------------------------------------------  Cardiac Enzymes Recent Labs  Lab 08/04/17 1456  TROPONINI <0.03   ------------------------------------------------------------------------------------------------------------------  RADIOLOGY:  Ct Head Wo Contrast  Result Date: 08/04/2017 CLINICAL DATA:  Slurred speech. EXAM: CT HEAD WITHOUT CONTRAST TECHNIQUE: Contiguous axial images were obtained from the base of the skull through the vertex without intravenous contrast. COMPARISON:  None. FINDINGS: Brain: No evidence of acute infarction, hemorrhage, hydrocephalus, extra-axial collection or mass lesion/mass effect. Vascular: No hyperdense vessel or unexpected calcification. Skull: Normal. Negative for fracture or focal lesion. Sinuses/Orbits: Bilateral ethmoid and maxillary sinusitis is noted. Other: None. IMPRESSION: Bilateral ethmoid and maxillary sinusitis. No acute intracranial abnormality seen. Electronically Signed  By: Lupita Raider, M.D.   On: 08/04/2017 15:25    EKG:   Orders placed or performed during the hospital encounter of 08/04/17  . ED EKG  . ED EKG  . EKG 12-Lead  . EKG 12-Lead    IMPRESSION AND PLAN:   [name] [name]  is a 47 y.o. male with a known history of hypertension, diabetes, sleep apnea, acne vulgaris, depression and anxiety brought to hospital secondary to slurred speech and muscle spasms.  1.  Hypocalcemia-check PTH, TSH and vitamin D levels -Continue IV and oral replacements and monitor -Check urine calcium  levels -Albumin is almost within normal limits - monitor for any seizure activity  2.  Severe hypokalemia- secondary to GI losses -Replace.  Check a magnesium level  3.  Leukocytosis-likely secondary to being on prednisone as outpatient  4.  Hypertension-continue Toprol and losartan, and Procardia  5.  Depression-continue outpatient medications.  Patient on Lexapro and Rexulti  6.  Diabetes mellitus-check A1c.  Restart glimepiride and sliding scale insulin.  Patient also on metformin  7. DVT prophylaxis- lovenox    All the records are reviewed and case discussed with ED provider. Management plans discussed with the patient, family and they are in agreement.  CODE STATUS: Full Code  TOTAL TIME TAKING CARE OF THIS PATIENT: 50 minutes.    Enid Baas M.D on 08/04/2017 at 4:43 PM  Between 7am to 6pm - Pager - [phone]  After 6pm go to [email or web address] - Social research officer, government  [facility]  Office  [phone]  CC: Primary care physician; Marguarite Arbour, MD

## 2017-08-04 NOTE — ED Notes (Signed)
Date and time results received: 06/06/191529 (use smartphrase ".now" to insert current time)  Test: potassium Critical Value: 2.5  Name of Provider Notified: Mayford KnifeWilliams   Orders Received? Or Actions Taken?: Orders Received - See Orders for details

## 2017-08-04 NOTE — ED Notes (Signed)
Called Pharmacy and spoke with Apolinar JunesBrandon and he stated he would send up medication immediately.

## 2017-08-04 NOTE — ED Notes (Signed)
Called Pharmacy and per Barbara CowerJason they will get medication sent up shortly.

## 2017-08-04 NOTE — ED Notes (Signed)
Pharmacy called and spoke with Barbara CowerJason and he stated that they would be sending up medication shortly.

## 2017-08-04 NOTE — ED Notes (Signed)
Attempted to call report. Per floor 1A they haven't accepted patient and may not be taking patient at this time. They will call back if they are taking them.

## 2017-08-04 NOTE — ED Notes (Signed)
Date and time results received: 08/04/17 1529 (use smartphrase ".now" to insert current time)  Test: calcium Critical Value: 6.0  Name of Provider Notified: Mayford KnifeWilliams  Orders Received? Or Actions Taken?: Orders Received - See Orders for details

## 2017-08-05 ENCOUNTER — Inpatient Hospital Stay: Payer: Medicare Other

## 2017-08-05 LAB — VITAMIN D 25 HYDROXY (VIT D DEFICIENCY, FRACTURES): Vit D, 25-Hydroxy: 15.9 ng/mL — ABNORMAL LOW (ref 30.0–100.0)

## 2017-08-05 LAB — GLUCOSE, CAPILLARY
GLUCOSE-CAPILLARY: 124 mg/dL — AB (ref 65–99)
GLUCOSE-CAPILLARY: 91 mg/dL (ref 65–99)
Glucose-Capillary: 90 mg/dL (ref 65–99)
Glucose-Capillary: 114 mg/dL — ABNORMAL HIGH (ref 65–99)

## 2017-08-05 LAB — BASIC METABOLIC PANEL
Anion gap: 10 (ref 5–15)
BUN: 9 mg/dL (ref 6–20)
CALCIUM: 5.9 mg/dL — AB (ref 8.9–10.3)
CHLORIDE: 94 mmol/L — AB (ref 101–111)
CO2: 37 mmol/L — AB (ref 22–32)
Creatinine, Ser: 0.95 mg/dL (ref 0.61–1.24)
GFR calc Af Amer: >60 mL/min (ref >60)
GFR calc non Af Amer: >60 mL/min (ref >60)
GLUCOSE: 83 mg/dL (ref 65–99)
POTASSIUM: 2.5 mmol/L — AB (ref 3.5–5.1)
Sodium: 141 mmol/L (ref 135–145)

## 2017-08-05 LAB — CBC
HEMATOCRIT: 32.9 % — AB (ref 40.0–52.0)
HEMOGLOBIN: 11.2 g/dL — AB (ref 13.0–18.0)
MCH: 27.5 pg (ref 26.0–34.0)
MCHC: 34.1 g/dL (ref 32.0–36.0)
MCV: 80.6 fL (ref 80.0–100.0)
Platelets: 220 10*3/uL (ref 150–440)
RBC: 4.09 MIL/uL — AB (ref 4.40–5.90)
RDW: 15.8 % — ABNORMAL HIGH (ref 11.5–14.5)
WBC: 9.9 10*3/uL (ref 3.8–10.6)

## 2017-08-05 LAB — MAGNESIUM: MAGNESIUM: 1.8 mg/dL (ref 1.7–2.4)

## 2017-08-05 LAB — LIPASE, BLOOD: Lipase: 25 U/L (ref 11–51)

## 2017-08-05 LAB — PHOSPHORUS: Phosphorus: 4.6 mg/dL (ref 2.5–4.6)

## 2017-08-05 LAB — POTASSIUM: Potassium: 2.6 mmol/L — CL (ref 3.5–5.1)

## 2017-08-05 LAB — PARATHYROID HORMONE, INTACT (NO CA): PTH: 38 pg/mL (ref 15–65)

## 2017-08-05 MED ORDER — SODIUM CHLORIDE 0.9 % IV SOLN
2.0000 g | Freq: Once | INTRAVENOUS | Status: AC
  Administered 2017-08-05: 2 g via INTRAVENOUS
  Filled 2017-08-05 (×3): qty 20

## 2017-08-05 MED ORDER — ADULT MULTIVITAMIN W/MINERALS CH
1.0000 | ORAL_TABLET | Freq: Every day | ORAL | Status: DC
  Administered 2017-08-06 – 2017-08-07 (×2): 1 via ORAL
  Filled 2017-08-05 (×2): qty 1

## 2017-08-05 MED ORDER — POTASSIUM CHLORIDE 10 MEQ/100ML IV SOLN
10.0000 meq | INTRAVENOUS | Status: AC
  Administered 2017-08-05 (×3): 10 meq via INTRAVENOUS
  Filled 2017-08-05: qty 100

## 2017-08-05 MED ORDER — POTASSIUM CHLORIDE 10 MEQ/100ML IV SOLN
10.0000 meq | INTRAVENOUS | Status: DC
  Administered 2017-08-05: 10 meq via INTRAVENOUS
  Filled 2017-08-05 (×4): qty 100

## 2017-08-05 MED ORDER — ENSURE ENLIVE PO LIQD
237.0000 mL | Freq: Three times a day (TID) | ORAL | Status: DC
  Administered 2017-08-05 – 2017-08-06 (×6): 237 mL via ORAL

## 2017-08-05 MED ORDER — SODIUM CHLORIDE 0.9 % IV SOLN
2.0000 g | Freq: Once | INTRAVENOUS | Status: DC
  Filled 2017-08-05: qty 20

## 2017-08-05 MED ORDER — VITAMIN D (ERGOCALCIFEROL) 1.25 MG (50000 UNIT) PO CAPS
50000.0000 [IU] | ORAL_CAPSULE | ORAL | Status: DC
  Administered 2017-08-05: 50000 [IU] via ORAL
  Filled 2017-08-05: qty 1

## 2017-08-05 MED ORDER — ENSURE ENLIVE PO LIQD: 237.0000 mL | Freq: Two times a day (BID) | ORAL | Status: DC

## 2017-08-05 MED ORDER — ZOLPIDEM TARTRATE 5 MG PO TABS
5.0000 mg | ORAL_TABLET | Freq: Every evening | ORAL | Status: DC | PRN
  Administered 2017-08-05 – 2017-08-06 (×2): 5 mg via ORAL
  Filled 2017-08-05 (×2): qty 1

## 2017-08-05 MED ORDER — BENZONATATE 100 MG PO CAPS
100.0000 mg | ORAL_CAPSULE | Freq: Three times a day (TID) | ORAL | Status: DC
Start: 2017-08-05 — End: 2017-08-07
  Administered 2017-08-05 – 2017-08-07 (×7): 100 mg via ORAL
  Filled 2017-08-05 (×6): qty 1

## 2017-08-05 MED ORDER — HYDROCOD POLST-CPM POLST ER 10-8 MG/5ML PO SUER
5.0000 mL | Freq: Two times a day (BID) | ORAL | Status: DC | PRN
  Administered 2017-08-06: 5 mL via ORAL
  Filled 2017-08-05: qty 5

## 2017-08-05 NOTE — Progress Notes (Signed)
MEDICATION RELATED CONSULT NOTE   Pharmacy Consult for electrolytes Indication: hypokalemia, hypocalcemia  Allergies  Allergen Reactions  . Penicillins Anaphylaxis    Happened as a child. Has patient had a PCN reaction causing immediate rash, facial/tongue/throat swelling, SOB or lightheadedness with hypotension: Yes Has patient had a PCN reaction causing severe rash involving mucus membranes or skin necrosis: No Has patient had a PCN reaction that required hospitalization: Unknown Has patient had a PCN reaction occurring within the last 10 years: No If all of the above answers are "NO", then may proceed with Cephalosporin use.  . Sulfa Antibiotics Anaphylaxis    Other reaction(s): Unknown  . Sulfasalazine Anaphylaxis  . Azithromycin Nausea And Vomiting and Rash    Patient Measurements: Height: 6\' 1"  (185.4 cm) Weight: 247 lb 1.6 oz (112.1 kg) IBW/kg (Calculated) : 79.9  Vital Signs: Temp: 97.9 F (36.6 C) (06/07 0802) Temp Source: Oral (06/07 0802) BP: 175/81 (06/07 0802) Pulse Rate: 86 (06/07 0802) Intake/Output from previous day: 06/06 0701 - 06/07 0700 In: -  Out: 200 [Urine:200] Intake/Output from this shift: No intake/output data recorded.  Labs: Recent Labs    08/04/17 1456 08/04/17 1944 08/05/17 0518  WBC 15.9*  --  9.9  HGB 12.4*  --  11.2*  HCT 37.9*  --  32.9*  PLT 338  --  220  CREATININE 1.04 0.96 0.95  MG 1.6*  --   --   PHOS  --   --  4.6  ALBUMIN 3.4*  --   --   PROT 7.4  --   --   AST 30  --   --   ALT 11*  --   --   ALKPHOS 106  --   --   BILITOT 0.5  --   --    Estimated Creatinine Clearance: 126.2 mL/min (by C-G formula based on SCr of 0.95 mg/dL).   Microbiology: No results found for this or any previous visit (from the past 720 hour(s)).  Medical History: Past Medical History:  Diagnosis Date  . Acne vulgaris   . Anxiety   . Depression   . Diabetes mellitus without complication (HCC)   . GERD (gastroesophageal reflux  disease)   . Hyperlipidemia   . Hypertension   . OSA (obstructive sleep apnea)      Assessment: [age] year year old male admitted on 08/04/17 with aphasia and found to have hypokalemia and hypocalcemia. His vitamin D level is 15.9 ng/mL, PTH is WNL. Since he was admitted he received the following electrolyte replacement therapy: 80mEq po KCl, 40mEq IV KCl, 2 grams IV calcium gluconate, 2 grams IV MgSO4  Goal of Therapy:  K 3.5-5 Mg 1.7-2.4 Ca (corrected): 8.9-10.3  Plan:  Will replace K with 40mEq IV KCl and recheck level one hour after last infusion. Will replace Ca with 2 grams IV calcium gluconate, add 50,000 units of vitD weekly and re-check level in the morning. I will also order a follow-up magnesium level.   Lowella Bandyodney D Cayle Thunder, PharmD 08/05/2017,8:43 AM

## 2017-08-05 NOTE — Progress Notes (Addendum)
Lab called in a for a critical value  Of 2.6 potassium at 2111. Notify Prime. Will continue to monitor.  Update 2320: Doctor Caryn Beemaier called and ordered a pharmacy consult. Will continue to monitor.

## 2017-08-05 NOTE — Progress Notes (Signed)
PT Cancellation Note  Patient Details Name: Terrilee CroakWilliam O Boedecker Jr. MRN: [id number] DOB: April 03, 1970   Cancelled Treatment:    Reason Eval/Treat Not Completed: Medical issues which prohibited therapy.  Low calcium and potassium levels.  Per protocol, will hold PT until pt more medically appropriate.    Encarnacion ChuAshley Abashian PT, DPT 08/05/2017, 1:33 PM

## 2017-08-05 NOTE — Consult Note (Signed)
[facility]  CONSULT NOTE    Date: 08/05/2017                  Patient Name:  [name] [name].  MRN: [id number]  DOB: [birth year]  Age / Sex: 47 y.o., male         PCP: Marguarite Arbour, MD                 Service Requesting Consult: Dr. Nemiah Commander                 Reason for Consult: Hypocalcemia            History of Present Illness: Mr. [name] [name]. is a 47 y.o. white male with diabetes mellitus type II, hypertension, COPD/asthma, depression, obstructive sleep apnea on CPAP, hyperlipidemia, acne vulgaris and developmental delay who was admitted to [facility] on 08/04/2017 for Hypocalcemia [E83.51] Hypokalemia [E87.6]  Wife at bedside who assists with history taking. Patient was recently taken off escitalopram but this was then restarted by psychiatry.   Patient had productive cough and diagnosed with acute bronchitis. Started on levofloxacin and prednisone.  Patient reports hand cramping and pain. Found to have potassium of 2.5 and calcium of 6.    Medications: Outpatient medications: Medications Prior to Admission  Medication Sig Dispense Refill Last Dose  . albuterol (PROVENTIL HFA;VENTOLIN HFA) 108 (90 Base) MCG/ACT inhaler Inhale 2 puffs into the lungs every 6 (six) hours as needed for wheezing or shortness of breath.   PRN at PRN  . atorvastatin (LIPITOR) 80 MG tablet Take 80 mg by mouth at bedtime.    08/03/2017 at pm  . Brexpiprazole (REXULTI) 2 MG TABS Take 2 mg by mouth daily.    08/04/2017 at am  . cetirizine (ZYRTEC) 10 MG tablet Take 10 mg by mouth at bedtime.    08/03/2017 at pm  . escitalopram (LEXAPRO) 10 MG tablet Take 10 mg by mouth daily.   08/04/2017 at am  . glimepiride (AMARYL) 4 MG tablet Take 4 mg by mouth daily with breakfast.   08/04/2017 at am  . ISOtretinoin (ACCUTANE) 20 MG capsule Take 20 mg by mouth daily.   08/04/2017 at am  . levofloxacin (LEVAQUIN) 500 MG tablet Take 500 mg by mouth daily.   08/03/2017 at am  . losartan (COZAAR)  50 MG tablet Take 50 mg by mouth at bedtime.    08/03/2017 at pm  . metoprolol succinate (TOPROL-XL) 50 MG 24 hr tablet Take 50 mg by mouth daily. Take with or immediately following a meal.   08/04/2017 at 1000  . NIFEdipine (PROCARDIA-XL/ADALAT CC) 30 MG 24 hr tablet Take 30 mg by mouth at bedtime.    08/03/2017 at pm  . omeprazole (PRILOSEC) 40 MG capsule Take 40 mg by mouth daily.   08/04/2017 at am  . ondansetron (ZOFRAN) 4 MG tablet Take 4 mg by mouth every 8 (eight) hours as needed for nausea.   PRN at PRN  . predniSONE (DELTASONE) 10 MG tablet Take 10-40 mg by mouth daily. Take 4 tablets daily for 2 days, then 3 tablets daily for 2 days, then 2 tablets daily for 2 days, then 1 tablet daily for 2 days, then stop.   08/03/2017 at am  . sitaGLIPtin-metformin (JANUMET) 50-500 MG tablet Take 1 tablet by mouth 2 (two) times daily with a meal.   08/04/2017 at am    Current medications: Current Facility-Administered Medications  Medication Dose Route Frequency Provider  Last Rate Last Dose  . acetaminophen (TYLENOL) tablet 650 mg  650 mg Oral Q6H PRN Enid Baas, MD       Or  . acetaminophen (TYLENOL) suppository 650 mg  650 mg Rectal Q6H PRN Enid Baas, MD      . albuterol (PROVENTIL) (2.5 MG/3ML) 0.083% nebulizer solution 2.5 mg  2.5 mg Inhalation Q6H PRN Enid Baas, MD      . atorvastatin (LIPITOR) tablet 80 mg  80 mg Oral QHS Enid Baas, MD   80 mg at 08/04/17 2107  . benzonatate (TESSALON) capsule 100 mg  100 mg Oral TID Adrian Saran, MD   100 mg at 08/05/17 1047  . Brexpiprazole TABS 2 mg  2 mg Oral Daily Enid Baas, MD   2 mg at 08/05/17 0915  . calcium carbonate (OS-CAL - dosed in mg of elemental calcium) tablet 500 mg of elemental calcium  1 tablet Oral BID WC Enid Baas, MD   500 mg of elemental calcium at 08/05/17 0916  . calcium gluconate 2 g in sodium chloride 0.9 % 100 mL IVPB  2 g Intravenous Once Mody, [name], MD      . chlorpheniramine-HYDROcodone  (TUSSIONEX) 10-8 MG/5ML suspension 5 mL  5 mL Oral Q12H PRN Mody, [name], MD      . docusate sodium (COLACE) capsule 100 mg  100 mg Oral BID Enid Baas, MD   100 mg at 08/05/17 0917  . enoxaparin (LOVENOX) injection 40 mg  40 mg Subcutaneous Q24H Enid Baas, MD   40 mg at 08/04/17 2123  . escitalopram (LEXAPRO) tablet 10 mg  10 mg Oral Daily Enid Baas, MD   10 mg at 08/05/17 1047  . glimepiride (AMARYL) tablet 4 mg  4 mg Oral Q breakfast Enid Baas, MD      . insulin aspart (novoLOG) injection 0-5 Units  0-5 Units Subcutaneous QHS Enid Baas, MD      . insulin aspart (novoLOG) injection 0-9 Units  0-9 Units Subcutaneous TID WC Enid Baas, MD      . ISOtretinoin (ACCUTANE) capsule 20 mg  20 mg Oral Daily Enid Baas, MD      . linagliptin (TRADJENTA) tablet 5 mg  5 mg Oral BID WC Enid Baas, MD   5 mg at 08/05/17 0911   And  . metFORMIN (GLUCOPHAGE) tablet 500 mg  500 mg Oral BID WC Enid Baas, MD   500 mg at 08/05/17 0917  . loratadine (CLARITIN) tablet 10 mg  10 mg Oral Daily Enid Baas, MD   10 mg at 08/05/17 0911  . losartan (COZAAR) tablet 50 mg  50 mg Oral QHS Enid Baas, MD   50 mg at 08/04/17 2113  . metoprolol succinate (TOPROL-XL) 24 hr tablet 50 mg  50 mg Oral Daily Enid Baas, MD   50 mg at 08/05/17 0913  . NIFEdipine (PROCARDIA-XL/ADALAT-CC/NIFEDICAL-XL) 24 hr tablet 30 mg  30 mg Oral QHS Enid Baas, MD   30 mg at 08/04/17 2113  . ondansetron (ZOFRAN) tablet 4 mg  4 mg Oral Q6H PRN Enid Baas, MD       Or  . ondansetron (ZOFRAN) injection 4 mg  4 mg Intravenous Q6H PRN Enid Baas, MD      . pantoprazole (PROTONIX) EC tablet 40 mg  40 mg Oral Daily Enid Baas, MD   40 mg at 08/05/17 0912  . potassium chloride 10 mEq in 100 mL IVPB  10 mEq Intravenous Q1 Hr x 4 Mody, [name], MD 100 mL/hr  at 08/05/17 1048 10 mEq at 08/05/17 1048  . potassium chloride SA  (K-DUR,KLOR-CON) CR tablet 20 mEq  20 mEq Oral Daily Enid BaasKalisetti, Radhika, MD   20 mEq at 08/05/17 0916  . Vitamin D (Ergocalciferol) (DRISDOL) capsule 50,000 Units  50,000 Units Oral Q7 days [name], [name], MD   50,000 Units at 08/05/17 [id number]      Allergies: Allergies  Allergen Reactions  . Penicillins Anaphylaxis    Happened as a child. Has patient had a PCN reaction causing immediate rash, facial/tongue/throat swelling, SOB or lightheadedness with hypotension: Yes Has patient had a PCN reaction causing severe rash involving mucus membranes or skin necrosis: No Has patient had a PCN reaction that required hospitalization: Unknown Has patient had a PCN reaction occurring within the last 10 years: No If all of the above answers are "NO", then may proceed with Cephalosporin use.  . Sulfa Antibiotics Anaphylaxis    Other reaction(s): Unknown  . Sulfasalazine Anaphylaxis  . Azithromycin Nausea And Vomiting and Rash      Past Medical History: Past Medical History:  Diagnosis Date  . Acne vulgaris   . Anxiety   . Depression   . Diabetes mellitus without complication (HCC)   . GERD (gastroesophageal reflux disease)   . Hyperlipidemia   . Hypertension   . OSA (obstructive sleep apnea)      Past Surgical History: Past Surgical History:  Procedure Laterality Date  . Gum Graft       Family History: Family History  Problem Relation Age of Onset  . Colon cancer Mother   . CAD Father   . Diabetes Sister      Social History: Social History   Socioeconomic History  . Marital status: Married    Spouse name: Not on file  . Number of children: Not on file  . Years of education: Not on file  . Highest education level: Not on file  Occupational History  . Not on file  Social Needs  . Financial resource strain: Not on file  . Food insecurity:    Worry: Not on file    Inability: Not on file  . Transportation needs:    Medical: Not on file    Non-medical: Not on file   Tobacco Use  . Smoking status: Never Smoker  . Smokeless tobacco: Never Used  Substance and Sexual Activity  . Alcohol use: No  . Drug use: No  . Sexual activity: Not on file  Lifestyle  . Physical activity:    Days per week: Not on file    Minutes per session: Not on file  . Stress: Not on file  Relationships  . Social connections:    Talks on phone: Not on file    Gets together: Not on file    Attends religious service: Not on file    Active member of club or organization: Not on file    Attends meetings of clubs or organizations: Not on file    Relationship status: Not on file  . Intimate partner violence:    Fear of current or ex partner: Not on file    Emotionally abused: Not on file    Physically abused: Not on file    Forced sexual activity: Not on file  Other Topics Concern  . 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  Negative for chills, diaphoresis, fever, malaise/fatigue and weight loss.  HENT: Negative.  Negative for congestion, ear discharge, ear pain, hearing loss, nosebleeds, sinus pain, sore throat and tinnitus.   Eyes: Negative.  Negative for blurred vision, double vision, photophobia, pain, discharge and redness.  Respiratory: Positive for cough, sputum production, shortness of breath and wheezing. Negative for hemoptysis and stridor.   Cardiovascular: Negative.  Negative for chest pain, palpitations, orthopnea, claudication, leg swelling and PND.  Gastrointestinal: Negative.  Negative for abdominal pain, blood in stool, constipation, diarrhea, heartburn, melena, nausea and vomiting.  Genitourinary: Negative.  Negative for dysuria, flank pain, frequency, hematuria and urgency.  Musculoskeletal: Positive for myalgias. Negative for back pain, falls, joint pain and neck pain.  Skin: Negative.  Negative for itching  and rash.  Neurological: Positive for focal weakness and weakness. Negative for dizziness, tingling, tremors, sensory change, speech change, seizures, loss of consciousness and headaches.  Endo/Heme/Allergies: Negative.  Negative for environmental allergies and polydipsia. Does not bruise/bleed easily.  Psychiatric/Behavioral: Negative.  Negative for depression, hallucinations, memory loss, substance abuse and suicidal ideas. The patient is not nervous/anxious and does not have insomnia.     Vital Signs: Blood pressure (!) 175/81, pulse 86, temperature 97.9 F (36.6 C), temperature source Oral, resp. rate 19, height 6\' 1"  (1.854 m), weight 112.1 kg (247 lb 1.6 oz), SpO2 93 %.  Weight trends: Filed Weights   08/04/17 1442 08/04/17 1826  Weight: 122.5 kg (270 lb) 112.1 kg (247 lb 1.6 oz)    Physical Exam: General: NAD, laying in bed  Head: Normocephalic, atraumatic. Moist oral mucosal membranes  Eyes: Anicteric, PERRL  Neck: Supple, trachea midline  Lungs:  Clear to auscultation  Heart: Regular rate and rhythm  Abdomen:  Soft, nontender,   Extremities: no peripheral edema.  Neurologic: Nonfocal, moving all four extremities  Skin: No lesions        Lab results: Basic Metabolic Panel: Recent Labs  Lab 08/04/17 1456 08/04/17 1944 08/05/17 0518  NA 141 142 141  K 2.5* 2.3* 2.5*  CL 87* 91* 94*  CO2 36* 34* 37*  GLUCOSE 143* 85 83  BUN 10 9 9   CREATININE 1.04 0.96 0.95  CALCIUM 6.0* 5.9* 5.9*  MG 1.6*  --   --   PHOS  --   --  4.6    Liver Function Tests: Recent Labs  Lab 08/04/17 1456  AST 30  ALT 11*  ALKPHOS 106  BILITOT 0.5  PROT 7.4  ALBUMIN 3.4*   Recent Labs  Lab 08/05/17 0518  LIPASE 25   No results for input(s): AMMONIA in the last 168 hours.  CBC: Recent Labs  Lab 08/04/17 1456 08/05/17 0518  WBC 15.9* 9.9  NEUTROABS 13.4*  --   HGB 12.4* 11.2*  HCT 37.9* 32.9*  MCV 81.2 80.6  PLT 338 220    Cardiac Enzymes: Recent Labs  Lab  08/04/17 1456  TROPONINI <0.03    BNP: Invalid input(s): POCBNP  CBG: Recent Labs  Lab 08/05/17 0804  GLUCAP 90    Microbiology: No results found for this or any previous visit.  Coagulation Studies: No results for input(s): LABPROT, INR in the last 72 hours.  Urinalysis: Recent Labs    08/04/17 2046  COLORURINE YELLOW*  LABSPEC 1.024  PHURINE 6.0  GLUCOSEU NEGATIVE  HGBUR NEGATIVE  BILIRUBINUR NEGATIVE  KETONESUR NEGATIVE  PROTEINUR 30*  NITRITE NEGATIVE  LEUKOCYTESUR NEGATIVE      Imaging: Dg Chest 1 View  Result Date: 08/05/2017 CLINICAL DATA:  Cough EXAM: CHEST  1 VIEW COMPARISON:  Report from 05/16/2013-images not available FINDINGS: Cardiomegaly. Patient has history of hypertension. Negative aortic and hilar contours accounting for leftward rotation. There is artifact from EKG leads. Azygos fissure which accounts for hazy widening of the upper mediastinum. No significant osseous finding. IMPRESSION: No evidence of active disease. Cardiomegaly. Electronically Signed   By: Marnee Spring M.D.   On: 08/05/2017 10:36   Ct Head Wo Contrast  Result Date: 08/04/2017 CLINICAL DATA:  Slurred speech. EXAM: CT HEAD WITHOUT CONTRAST TECHNIQUE: Contiguous axial images were obtained from the base of the skull through the vertex without intravenous contrast. COMPARISON:  None. FINDINGS: Brain: No evidence of acute infarction, hemorrhage, hydrocephalus, extra-axial collection or mass lesion/mass effect. Vascular: No hyperdense vessel or unexpected calcification. Skull: Normal. Negative for fracture or focal lesion. Sinuses/Orbits: Bilateral ethmoid and maxillary sinusitis is noted. Other: None. IMPRESSION: Bilateral ethmoid and maxillary sinusitis. No acute intracranial abnormality seen. Electronically Signed   By: Lupita Raider, M.D.   On: 08/04/2017 15:25      Assessment & Plan: Mr. Sheila Gervasi. is a 47 y.o. white male with diabetes mellitus type II, hypertension,  COPD/asthma, depression, obstructive sleep apnea on CPAP, hyperlipidemia, acne vulgaris and developmental delay who was admitted to [facility] on 08/04/2017 for Hypocalcemia [E83.51] Hypokalemia [E87.6]  1. Hypocalcemia 2. Hypokalemia 3. Metabolic alkalosis 4. Hypertension 5. Diabetes mellitus type II 6. Proteinuria  Impression Unclear why patient has symptomatic hypokalemia and hypocalcemia. Escitalopram, levofloxacin and prednisone do not usually cause changes in calcium. Phosphorus at goal. Lipase at goal  - Discontinue IV normal saline.  - Continue Potassium replacement - Replace magnesium - Pending PTH, HIV, vitamin D, SPEP/UPEP - May need a further malignancy work up  LOS: 1 [name] [name] 6/7/201910:54 AM

## 2017-08-05 NOTE — Progress Notes (Signed)
Pt wife requested a medicine for sleep. Page prime. Doctor Elisabeth PigeonVachhani ordered PRN Ambien 5 mg Oral at bedtime. Will continue to monitor.

## 2017-08-05 NOTE — Progress Notes (Signed)
Initial Nutrition Assessment  DOCUMENTATION CODES:   Obesity unspecified  INTERVENTION:   Pt likely at moderate to high refeeding risk; recommend continue to monitor electrolytes after oral intake improves.   Ensure Enlive po BID, each supplement provides 350 kcal and 20 grams of protein  MVI daily  Liberalize diet   NUTRITION DIAGNOSIS:   Inadequate oral intake related to acute illness as evidenced by per patient/family report.  GOAL:   Patient will meet greater than or equal to 90% of their needs  MONITOR:   PO intake, Supplement acceptance, Labs, Weight trends, Skin, I & O's  REASON FOR ASSESSMENT:   Malnutrition Screening Tool    ASSESSMENT:   47 y.o. white male with diabetes mellitus type II, hypertension, COPD/asthma, depression, obstructive sleep apnea on CPAP, hyperlipidemia, acne vulgaris and developmental delay who was admitted to [facility] on 08/04/2017 for Hypocalcemia E83.51   Met with pt in room today. Pt sleeping with CPAP mask on at time of RD visit. Pt reports poor appetite and oral intake for the past week. Per chart, pt has lost 18lbs(7%) over the past 2 months; this is not significant given the time frame but worth noting. Pt reports continued poor appetite and oral intake today; pt eating only bites of meals. Pt does not drink any nutritional supplements at home but is willing to drink chocolate Ensure while here. Pt noted to have multiple electrolyte and calcium abnormalities that are being supplemented. Pt denies any history of gastric surgeries or complications with GI illness. RD will add supplements and liberalize diet to help pt meet his estimated needs. Pt noted to have low vitamin D; supplementation initiated. Pt likely at moderate to high refeeding risk; recommend continue to monitor electrolytes after oral intake improves.   Medications reviewed and include: calcium carbonate, lovenox, glimepiride, insulin, metformin, protonix, KCl, vitamin D, calcium  gluconate   Labs reviewed: K 2.5(L), Cl 94(L), Ca 5.9(L) adj. 6.38(L), P 4.6 wnl, Mg 1.8 wnl Alb 3.4(L)- 6/6 Vitamin D 15.9(L)- 6/6 iPTH- 38- 6/6  NUTRITION - FOCUSED PHYSICAL EXAM:    Most Recent Value  Orbital Region  No depletion  Upper Arm Region  No depletion  Thoracic and Lumbar Region  No depletion  Buccal Region  No depletion  Temple Region  No depletion  Clavicle Bone Region  No depletion  Clavicle and Acromion Bone Region  No depletion  Scapular Bone Region  No depletion  Dorsal Hand  No depletion  Patellar Region  No depletion  Anterior Thigh Region  No depletion  Posterior Calf Region  No depletion  Edema (RD Assessment)  None  Hair  Reviewed  Eyes  Reviewed  Mouth  Reviewed  Skin  Reviewed  Nails  Reviewed     Diet Order:   Diet Order           Diet heart healthy/carb modified Room service appropriate? Yes; Fluid consistency: Thin  Diet effective now         EDUCATION NEEDS:   Education needs have been addressed  Skin:  Skin Assessment: Reviewed RN Assessment  Last BM:  6/6  Height:   Ht Readings from Last 1 Encounters:  08/04/17 '6\' 1"'  (1.854 m)    Weight:   Wt Readings from Last 1 Encounters:  08/04/17 247 lb 1.6 oz (112.1 kg)    Ideal Body Weight:  83.6 kg  BMI:  Body mass index is 32.6 kg/m.  Estimated Nutritional Needs:   Kcal:  2300-2600kcal/day   Protein:  112-135g/day   Fluid:  >2.5L/day   Koleen Distance MS, RD, LDN Pager #- [phone] Office#- [phone] After Hours Pager: [phone]

## 2017-08-05 NOTE — Progress Notes (Signed)
[facility] [place] [facility]   PATIENT NAME: [name] [name]    MR#:  [id number]  DATE OF BIRTH:  [birth year]  SUBJECTIVE:   Patient has a cough no other acute events overnight  REVIEW OF SYSTEMS:    Review of Systems  Constitutional: Negative for fever, chills weight loss HENT: Negative for ear pain, nosebleeds, congestion, facial swelling, rhinorrhea, neck pain, neck stiffness and ear discharge.   Respiratory: Positive cough no shortness of breath or wheezing  cardiovascular: Negative for chest pain, palpitations and leg swelling.  Gastrointestinal: Negative for heartburn, abdominal pain, vomiting, diarrhea or consitpation Genitourinary: Negative for dysuria, urgency, frequency, hematuria Musculoskeletal: Negative for back pain or joint pain Neurological: Negative for dizziness, seizures, syncope, focal weakness,  numbness and headaches.  Hematological: Does not bruise/bleed easily.  Psychiatric/Behavioral: Negative for hallucinations, confusion, dysphoric mood    Tolerating Diet: yes      DRUG ALLERGIES:   Allergies  Allergen Reactions  . Penicillins Anaphylaxis    Happened as a child. Has patient had a PCN reaction causing immediate rash, facial/tongue/throat swelling, SOB or lightheadedness with hypotension: Yes Has patient had a PCN reaction causing severe rash involving mucus membranes or skin necrosis: No Has patient had a PCN reaction that required hospitalization: Unknown Has patient had a PCN reaction occurring within the last 10 years: No If all of the above answers are "NO", then may proceed with Cephalosporin use.  . Sulfa Antibiotics Anaphylaxis    Other reaction(s): Unknown  . Sulfasalazine Anaphylaxis  . Azithromycin Nausea And Vomiting and Rash    VITALS:  Blood pressure (!) 175/81, pulse 86, temperature 97.9 F (36.6 C), temperature source Oral, resp. rate 19, height 6\' 1"  (1.854 m), weight 112.1 kg (247 lb 1.6 oz), SpO2 93  %.  PHYSICAL EXAMINATION:  Constitutional: Appears well-developed and well-nourished. No distress. HENT: Normocephalic. Marland Kitchen Oropharynx is clear and moist.  Eyes: Conjunctivae and EOM are normal. PERRLA, no scleral icterus.  Neck: Normal ROM. Neck supple. No JVD. No tracheal deviation. CVS: RRR, S1/S2 +, no murmurs, no gallops, no carotid bruit.  Pulmonary: Effort and breath sounds normal, no stridor, rhonchi, wheezes, rales.  Abdominal: Soft. BS +,  no distension, tenderness, rebound or guarding.  Musculoskeletal: Normal range of motion. No edema and no tenderness.  Neuro: Alert. CN 2-12 grossly intact. No focal deficits. Skin: Skin is warm and dry. No rash noted. Psychiatric: Normal mood and affect.      LABORATORY PANEL:   CBC Recent Labs  Lab 08/05/17 0518  WBC 9.9  HGB 11.2*  HCT 32.9*  PLT 220   ------------------------------------------------------------------------------------------------------------------  Chemistries  Recent Labs  Lab 08/04/17 1456  08/05/17 0518  NA 141   < > 141  K 2.5*   < > 2.5*  CL 87*   < > 94*  CO2 36*   < > 37*  GLUCOSE 143*   < > 83  BUN 10   < > 9  CREATININE 1.04   < > 0.95  CALCIUM 6.0*   < > 5.9*  MG 1.6*  --   --   AST 30  --   --   ALT 11*  --   --   ALKPHOS 106  --   --   BILITOT 0.5  --   --    < > = values in this interval not displayed.   ------------------------------------------------------------------------------------------------------------------  Cardiac Enzymes Recent Labs  Lab 08/04/17 1456  TROPONINI <0.03   ------------------------------------------------------------------------------------------------------------------  RADIOLOGY:  Dg Chest 1 View  Result Date: 08/05/2017 CLINICAL DATA:  Cough EXAM: CHEST  1 VIEW COMPARISON:  Report from 05/16/2013-images not available FINDINGS: Cardiomegaly. Patient has history of hypertension. Negative aortic and hilar contours accounting for leftward rotation.  There is artifact from EKG leads. Azygos fissure which accounts for hazy widening of the upper mediastinum. No significant osseous finding. IMPRESSION: No evidence of active disease. Cardiomegaly. Electronically Signed   By: Marnee SpringJonathon  Watts M.D.   On: 08/05/2017 10:36   Ct Head Wo Contrast  Result Date: 08/04/2017 CLINICAL DATA:  Slurred speech. EXAM: CT HEAD WITHOUT CONTRAST TECHNIQUE: Contiguous axial images were obtained from the base of the skull through the vertex without intravenous contrast. COMPARISON:  None. FINDINGS: Brain: No evidence of acute infarction, hemorrhage, hydrocephalus, extra-axial collection or mass lesion/mass effect. Vascular: No hyperdense vessel or unexpected calcification. Skull: Normal. Negative for fracture or focal lesion. Sinuses/Orbits: Bilateral ethmoid and maxillary sinusitis is noted. Other: None. IMPRESSION: Bilateral ethmoid and maxillary sinusitis. No acute intracranial abnormality seen. Electronically Signed   By: Lupita RaiderJames  Green Jr, M.D.   On: 08/04/2017 15:25     ASSESSMENT AND PLAN:   47 year old male with a history of diabetes and depression who presented with slurred speech and muscle spasms.   1.  Severe electrolyte abnormalities including low potassium and calcium: Vitamin D level is low and therefore vitamin D supplementation has been started. Permissive consultation for electrolyte replacement Follow-up on nephrology recommendation Follow-up on PTH TSH was within normal limits  2.  Leukocytosis due to prednisone as an outpatient  3.  Cough: Obtain chest x-ray Cough syrup and Tessalon Perles ordered. 4.  Essential hypertension: Continue losartan and metoprolol and nifedipine  5.  Diabetes: Continue sliding scale with outpatient regimen and ADA diet  .  Depression: Continue outpatient medications with Lexapro   Management plans discussed with the patient and he is in agreement.  CODE STATUS: Full  TOTAL TIME TAKING CARE OF THIS PATIENT:  30 minutes.     POSSIBLE D/C 1-2 days, DEPENDING ON CLINICAL CONDITION.   [name] [name] M.D on 08/05/2017 at 10:45 AM  Between 7am to 6pm - Pager - [phone] After 6pm go to [email or web address] - password [email or web address]  [facility]  Office  [phone]  CC: Primary care physician; Marguarite ArbourSparks, Jeffrey D, MD  Note: This dictation was prepared with Dragon dictation along with smaller phrase technology. Any transcriptional errors that result from this process are unintentional.

## 2017-08-06 LAB — COMPREHENSIVE METABOLIC PANEL
ALT: 10 U/L — AB (ref 17–63)
AST: 16 U/L (ref 15–41)
Albumin: 2.9 g/dL — ABNORMAL LOW (ref 3.5–5.0)
Alkaline Phosphatase: 97 U/L (ref 38–126)
Anion gap: 12 (ref 5–15)
BUN: 9 mg/dL (ref 6–20)
CHLORIDE: 93 mmol/L — AB (ref 101–111)
CO2: 35 mmol/L — AB (ref 22–32)
Calcium: 6.7 mg/dL — ABNORMAL LOW (ref 8.9–10.3)
Creatinine, Ser: 0.95 mg/dL (ref 0.61–1.24)
GFR calc Af Amer: >60 mL/min (ref >60)
GFR calc non Af Amer: >60 mL/min (ref >60)
Glucose, Bld: 88 mg/dL (ref 65–99)
Potassium: 2.8 mmol/L — ABNORMAL LOW (ref 3.5–5.1)
SODIUM: 140 mmol/L (ref 135–145)
Total Bilirubin: 0.5 mg/dL (ref 0.3–1.2)
Total Protein: 6.5 g/dL (ref 6.5–8.1)

## 2017-08-06 LAB — GLUCOSE, CAPILLARY
GLUCOSE-CAPILLARY: 82 mg/dL (ref 65–99)
GLUCOSE-CAPILLARY: 90 mg/dL (ref 65–99)
Glucose-Capillary: 70 mg/dL (ref 65–99)
Glucose-Capillary: 122 mg/dL — ABNORMAL HIGH (ref 65–99)
Glucose-Capillary: 68 mg/dL (ref 65–99)

## 2017-08-06 LAB — POTASSIUM: Potassium: 3 mmol/L — ABNORMAL LOW (ref 3.5–5.1)

## 2017-08-06 LAB — MAGNESIUM: Magnesium: 1.9 mg/dL (ref 1.7–2.4)

## 2017-08-06 LAB — HIV ANTIBODY (ROUTINE TESTING W REFLEX): HIV Screen 4th Generation wRfx: NONREACTIVE

## 2017-08-06 MED ORDER — MAGNESIUM OXIDE 400 (241.3 MG) MG PO TABS
400.0000 mg | ORAL_TABLET | Freq: Every day | ORAL | Status: DC
  Administered 2017-08-06 – 2017-08-07 (×2): 400 mg via ORAL
  Filled 2017-08-06 (×2): qty 1

## 2017-08-06 MED ORDER — POTASSIUM CHLORIDE 10 MEQ/100ML IV SOLN
10.0000 meq | INTRAVENOUS | Status: AC
  Administered 2017-08-06 (×5): 10 meq via INTRAVENOUS
  Filled 2017-08-06 (×5): qty 100

## 2017-08-06 MED ORDER — POTASSIUM CHLORIDE 20 MEQ PO PACK
40.0000 meq | PACK | ORAL | Status: AC
  Administered 2017-08-06 (×2): 40 meq via ORAL
  Filled 2017-08-06 (×2): qty 2

## 2017-08-06 MED ORDER — POTASSIUM CHLORIDE 10 MEQ/100ML IV SOLN
10.0000 meq | INTRAVENOUS | Status: AC
  Administered 2017-08-06 – 2017-08-07 (×4): 10 meq via INTRAVENOUS
  Filled 2017-08-06 (×4): qty 100

## 2017-08-06 MED ORDER — POTASSIUM CHLORIDE CRYS ER 20 MEQ PO TBCR: 20.0000 meq | EXTENDED_RELEASE_TABLET | Freq: Two times a day (BID) | ORAL | Status: DC

## 2017-08-06 NOTE — Progress Notes (Signed)
MEDICATION RELATED CONSULT NOTE   Pharmacy Consult for electrolytes Indication: hypokalemia, hypocalcemia  Allergies  Allergen Reactions  . Penicillins Anaphylaxis    Happened as a child. Has patient had a PCN reaction causing immediate rash, facial/tongue/throat swelling, SOB or lightheadedness with hypotension: Yes Has patient had a PCN reaction causing severe rash involving mucus membranes or skin necrosis: No Has patient had a PCN reaction that required hospitalization: Unknown Has patient had a PCN reaction occurring within the last 10 years: No If all of the above answers are "NO", then may proceed with Cephalosporin use.  . Sulfa Antibiotics Anaphylaxis    Other reaction(s): Unknown  . Sulfasalazine Anaphylaxis  . Azithromycin Nausea And Vomiting and Rash    Patient Measurements: Height: 6\' 1"  (185.4 cm) Weight: 247 lb 1.6 oz (112.1 kg) IBW/kg (Calculated) : 79.9  Vital Signs: Temp: 98.7 F (37.1 C) (06/07 1951) Temp Source: Oral (06/07 1951) BP: 176/99 (06/07 1951) Pulse Rate: 78 (06/07 1951) Intake/Output from previous day: 06/07 0701 - 06/08 0700 In: 237 [P.O.:237] Out: 900 [Urine:900] Intake/Output from this shift: Total I/O In: 237 [P.O.:237] Out: -   Labs: Recent Labs    08/04/17 1456 08/04/17 1944 08/05/17 0518  WBC 15.9*  --  9.9  HGB 12.4*  --  11.2*  HCT 37.9*  --  32.9*  PLT 338  --  220  CREATININE 1.04 0.96 0.95  MG 1.6*  --  1.8  PHOS  --   --  4.6  ALBUMIN 3.4*  --   --   PROT 7.4  --   --   AST 30  --   --   ALT 11*  --   --   ALKPHOS 106  --   --   BILITOT 0.5  --   --    Estimated Creatinine Clearance: 126.2 mL/min (by C-G formula based on SCr of 0.95 mg/dL).   Microbiology: No results found for this or any previous visit (from the past 720 hour(s)).  Medical History: Past Medical History:  Diagnosis Date  . Acne vulgaris   . Anxiety   . Depression   . Diabetes mellitus without complication (HCC)   . GERD  (gastroesophageal reflux disease)   . Hyperlipidemia   . Hypertension   . OSA (obstructive sleep apnea)      Assessment: [age] year year old male admitted on 08/04/17 with aphasia and found to have hypokalemia and hypocalcemia. His vitamin D level is 15.9 ng/mL, PTH is WNL. Since he was admitted he received the following electrolyte replacement therapy: 80mEq po KCl, 40mEq IV KCl, 2 grams IV calcium gluconate, 2 grams IV MgSO4  Goal of Therapy:  K 3.5-5 Mg 1.7-2.4 Ca (corrected): 8.9-10.3  Plan:  Will replace K with 40mEq IV KCl and recheck level one hour after last infusion. Will replace Ca with 2 grams IV calcium gluconate, add 50,000 units of vitD weekly and re-check level in the morning. I will also order a follow-up magnesium level.   06/07 @ 2200 K 2.6. Will replace w/ KCI 10 mEq IV x 5 followed by PO KCI 20 mEq x 2 doses. Will recheck CMP w/ am labs.  Thomasene Rippleavid  Daley Mooradian, PharmD 08/06/2017,12:12 AM

## 2017-08-06 NOTE — Progress Notes (Signed)
MEDICATION RELATED CONSULT NOTE   Pharmacy Consult for electrolytes Indication: hypokalemia, hypocalcemia  Allergies  Allergen Reactions  . Penicillins Anaphylaxis    Happened as a child. Has patient had a PCN reaction causing immediate rash, facial/tongue/throat swelling, SOB or lightheadedness with hypotension: Yes Has patient had a PCN reaction causing severe rash involving mucus membranes or skin necrosis: No Has patient had a PCN reaction that required hospitalization: Unknown Has patient had a PCN reaction occurring within the last 10 years: No If all of the above answers are "NO", then may proceed with Cephalosporin use.  . Sulfa Antibiotics Anaphylaxis    Other reaction(s): Unknown  . Sulfasalazine Anaphylaxis  . Azithromycin Nausea And Vomiting and Rash    Patient Measurements: Height: 6\' 1"  (185.4 cm) Weight: 247 lb 1.6 oz (112.1 kg) IBW/kg (Calculated) : 79.9  Vital Signs: Temp: 98.5 F (36.9 C) (06/08 0347) Temp Source: Oral (06/07 1951) BP: 143/90 (06/08 0551) Pulse Rate: 81 (06/08 0551) Intake/Output from previous day: 06/07 0701 - 06/08 0700 In: 237 [P.O.:237] Out: 900 [Urine:900] Intake/Output from this shift: No intake/output data recorded.  Labs: Recent Labs    08/04/17 1456 08/04/17 1944 08/05/17 0518 08/06/17 0606  WBC 15.9*  --  9.9  --   HGB 12.4*  --  11.2*  --   HCT 37.9*  --  32.9*  --   PLT 338  --  220  --   CREATININE 1.04 0.96 0.95 0.95  MG 1.6*  --  1.8 1.9  PHOS  --   --  4.6  --   ALBUMIN 3.4*  --   --  2.9*  PROT 7.4  --   --  6.5  AST 30  --   --  16  ALT 11*  --   --  10*  ALKPHOS 106  --   --  97  BILITOT 0.5  --   --  0.5   Estimated Creatinine Clearance: 126.2 mL/min (by C-G formula based on SCr of 0.95 mg/dL).   Microbiology: No results found for this or any previous visit (from the past 720 hour(s)).  Medical History: Past Medical History:  Diagnosis Date  . Acne vulgaris   . Anxiety   . Depression   .  Diabetes mellitus without complication (HCC)   . GERD (gastroesophageal reflux disease)   . Hyperlipidemia   . Hypertension   . OSA (obstructive sleep apnea)      Assessment: [age] year year old male admitted on 08/04/17 with aphasia and found to have hypokalemia and hypocalcemia. His vitamin D level is 15.9 ng/mL, PTH is WNL.   Goal of Therapy:  K 3.5-5 Mg 1.7-2.4 Ca (corrected): 8.9-10.3  Plan:  0608 Mg: 1.9, K: 2.8, corrected calcium: 7.6. Will order KCL 40mEq every 4 hours x 2 doses. Patient also Receiving KCL 20mEq twice daily. Continue PO Calcium supplementation. Will start MagOx 400mg  daily.   Will recheck K+ level at 1800.   Gardner CandleSheema M Nixie Laube, PharmD, BCPS Clinical Pharmacist 08/06/2017 7:52 AM

## 2017-08-06 NOTE — Progress Notes (Signed)
[employer] [facility] - [place] [facility]   PATIENT NAME: [name] [name]    MR#:  [id number]  DATE OF BIRTH:  08/10/1970  SUBJECTIVE:  No events reported overnight.  Potassium level still low.  REVIEW OF SYSTEMS:    Review of Systems  Constitutional: Negative for fever, chills weight loss HENT: Negative for ear pain, nosebleeds, congestion, facial swelling, rhinorrhea, neck pain, neck stiffness and ear discharge.   Respiratory: Positive cough no shortness of breath or wheezing  cardiovascular: Negative for chest pain, palpitations and leg swelling.  Gastrointestinal: Negative for heartburn, abdominal pain, vomiting, diarrhea or consitpation Genitourinary: Negative for dysuria, urgency, frequency, hematuria Musculoskeletal: Negative for back pain or joint pain Neurological: Negative for dizziness, seizures, syncope, focal weakness,  numbness and headaches.  Hematological: Does not bruise/bleed easily.  Psychiatric/Behavioral: Negative for hallucinations, confusion, dysphoric mood    Tolerating Diet: yes      DRUG ALLERGIES:   Allergies  Allergen Reactions  . Penicillins Anaphylaxis    Happened as a child. Has patient had a PCN reaction causing immediate rash, facial/tongue/throat swelling, SOB or lightheadedness with hypotension: Yes Has patient had a PCN reaction causing severe rash involving mucus membranes or skin necrosis: No Has patient had a PCN reaction that required hospitalization: Unknown Has patient had a PCN reaction occurring within the last 10 years: No If all of the above answers are "NO", then may proceed with Cephalosporin use.  . Sulfa Antibiotics Anaphylaxis    Other reaction(s): Unknown  . Sulfasalazine Anaphylaxis  . Azithromycin Nausea And Vomiting and Rash    VITALS:  Blood pressure (!) 143/90, pulse 81, temperature 98.5 F (36.9 C), resp. rate 18, height 6\' 1"  (1.854 m), weight 112.1 kg (247 lb 1.6 oz), SpO2 92 %.  PHYSICAL  EXAMINATION:  Constitutional: Appears well-developed and well-nourished. No distress. HENT: Normocephalic. Marland Kitchen Oropharynx is clear and moist.  Eyes: Conjunctivae and EOM are normal. PERRLA, no scleral icterus.  Neck: Normal ROM. Neck supple. No JVD. No tracheal deviation. CVS: RRR, S1/S2 +, no murmurs, no gallops, no carotid bruit.  Pulmonary: Effort and breath sounds normal, no stridor, rhonchi, wheezes, rales.  Abdominal: Soft. BS +,  no distension, tenderness, rebound or guarding.  Musculoskeletal: Normal range of motion. No edema and no tenderness.  Neuro: Alert. CN 2-12 grossly intact. No focal deficits. Skin: Skin is warm and dry. No rash noted. Psychiatric: Normal mood and affect.      LABORATORY PANEL:   CBC Recent Labs  Lab 08/05/17 0518  WBC 9.9  HGB 11.2*  HCT 32.9*  PLT 220   ------------------------------------------------------------------------------------------------------------------  Chemistries  Recent Labs  Lab 08/06/17 0606  NA 140  K 2.8*  CL 93*  CO2 35*  GLUCOSE 88  BUN 9  CREATININE 0.95  CALCIUM 6.7*  MG 1.9  AST 16  ALT 10*  ALKPHOS 97  BILITOT 0.5   ------------------------------------------------------------------------------------------------------------------  Cardiac Enzymes Recent Labs  Lab 08/04/17 1456  TROPONINI <0.03   ------------------------------------------------------------------------------------------------------------------  RADIOLOGY:  Dg Chest 1 View  Result Date: 08/05/2017 CLINICAL DATA:  Cough EXAM: CHEST  1 VIEW COMPARISON:  Report from 05/16/2013-images not available FINDINGS: Cardiomegaly. Patient has history of hypertension. Negative aortic and hilar contours accounting for leftward rotation. There is artifact from EKG leads. Azygos fissure which accounts for hazy widening of the upper mediastinum. No significant osseous finding. IMPRESSION: No evidence of active disease. Cardiomegaly. Electronically Signed    By: Marnee Spring M.D.   On: 08/05/2017 10:36  Ct Head Wo Contrast  Result Date: 08/04/2017 CLINICAL DATA:  Slurred speech. EXAM: CT HEAD WITHOUT CONTRAST TECHNIQUE: Contiguous axial images were obtained from the base of the skull through the vertex without intravenous contrast. COMPARISON:  None. FINDINGS: Brain: No evidence of acute infarction, hemorrhage, hydrocephalus, extra-axial collection or mass lesion/mass effect. Vascular: No hyperdense vessel or unexpected calcification. Skull: Normal. Negative for fracture or focal lesion. Sinuses/Orbits: Bilateral ethmoid and maxillary sinusitis is noted. Other: None. IMPRESSION: Bilateral ethmoid and maxillary sinusitis. No acute intracranial abnormality seen. Electronically Signed   By: Lupita RaiderJames  Green Jr, M.D.   On: 08/04/2017 15:25     ASSESSMENT AND PLAN:   47 year old male with a history of diabetes and depression who presented with slurred speech and muscle spasms.   1.  Severe electrolyte abnormalities including low potassium and calcium: Vitamin D level is low and therefore vitamin D supplementation has been started. Thomas he consultation for electrolyte replacement SH and PTH were all normal  2.  Leukocytosis due to prednisone as an outpatient  3.  Cough: X-ray shows no evidence of bronchitis or pneumonia Cough syrup and Tessalon Perles ordered. 4.  Essential hypertension: Continue losartan and metoprolol and nifedipine  5.  Diabetes: Continue sliding scale with outpatient regimen and ADA diet  6.  Depression: Continue  Lexapro Physical therapy consultation for discharge planning  Management plans discussed with the patient and he is in agreement.  CODE STATUS: Full  TOTAL TIME TAKING CARE OF THIS PATIENT: 24 minutes.     POSSIBLE D/C tomorrow, DEPENDING ON CLINICAL CONDITION.   Channing Yeager M.D on 08/06/2017 at 9:15 AM  Between 7am to 6pm - Pager - [phone] After 6pm go to [email or web address] - password [email or web address]  [facility]  Office  [phone]  CC: Primary care physician; Marguarite ArbourSparks, Jeffrey D, MD  Note: This dictation was prepared with Dragon dictation along with smaller phrase technology. Any transcriptional errors that result from this process are unintentional.

## 2017-08-06 NOTE — Progress Notes (Signed)
[facility]  ROUNDING NOTE   Subjective:   PTH 38. Vitamin D level 15.9  Calcium 6.7 K 2.8  Reports no more hand cramping  Objective:  Vital signs in last 24 hours:  Temp:  [98.3 F (36.8 C)-98.7 F (37.1 C)] 98.5 F (36.9 C) (06/08 0347) Pulse Rate:  [78-89] 82 (06/08 1137) Resp:  [18] 18 (06/08 1137) BP: (143-216)/(81-107) 181/81 (06/08 1137) SpO2:  [91 %-95 %] 94 % (06/08 1137)  Weight change:  Filed Weights   08/04/17 1442 08/04/17 1826  Weight: 122.5 kg (270 lb) 112.1 kg (247 lb 1.6 oz)    Intake/Output: I/O last 3 completed shifts: In: 237 [P.O.:237] Out: 1100 [Urine:1100]   Intake/Output this shift:  No intake/output data recorded.  Physical Exam: General: NAD, laying in bed  Head: Normocephalic, atraumatic. Moist oral mucosal membranes  Eyes: Anicteric, PERRL  Neck: Supple, trachea midline  Lungs:  Clear to auscultation  Heart: Regular rate and rhythm  Abdomen:  Soft, nontender,   Extremities:  no peripheral edema.  Neurologic: Nonfocal, moving all four extremities  Skin: No lesions        Basic Metabolic Panel: Recent Labs  Lab 08/04/17 1456 08/04/17 1944 08/05/17 0518 08/05/17 2222 08/06/17 0606  NA 141 142 141  --  140  K 2.5* 2.3* 2.5* 2.6* 2.8*  CL 87* 91* 94*  --  93*  CO2 36* 34* 37*  --  35*  GLUCOSE 143* 85 83  --  88  BUN 10 9 9   --  9  CREATININE 1.04 0.96 0.95  --  0.95  CALCIUM 6.0* 5.9* 5.9*  --  6.7*  MG 1.6*  --  1.8  --  1.9  PHOS  --   --  4.6  --   --     Liver Function Tests: Recent Labs  Lab 08/04/17 1456 08/06/17 0606  AST 30 16  ALT 11* 10*  ALKPHOS 106 97  BILITOT 0.5 0.5  PROT 7.4 6.5  ALBUMIN 3.4* 2.9*   Recent Labs  Lab 08/05/17 0518  LIPASE 25   No results for input(s): AMMONIA in the last 168 hours.  CBC: Recent Labs  Lab 08/04/17 1456 08/05/17 0518  WBC 15.9* 9.9  NEUTROABS 13.4*  --   HGB 12.4* 11.2*  HCT 37.9* 32.9*  MCV 81.2 80.6  PLT 338 220    Cardiac  Enzymes: Recent Labs  Lab 08/04/17 1456  TROPONINI <0.03    BNP: Invalid input(s): POCBNP  CBG: Recent Labs  Lab 08/05/17 0804 08/05/17 1143 08/05/17 1641 08/05/17 2106 08/06/17 0837  GLUCAP 90 114* 124* 91 70    Microbiology: No results found for this or any previous visit.  Coagulation Studies: No results for input(s): LABPROT, INR in the last 72 hours.  Urinalysis: Recent Labs    08/04/17 2046  COLORURINE YELLOW*  LABSPEC 1.024  PHURINE 6.0  GLUCOSEU NEGATIVE  HGBUR NEGATIVE  BILIRUBINUR NEGATIVE  KETONESUR NEGATIVE  PROTEINUR 30*  NITRITE NEGATIVE  LEUKOCYTESUR NEGATIVE      Imaging: Dg Chest 1 View  Result Date: 08/05/2017 CLINICAL DATA:  Cough EXAM: CHEST  1 VIEW COMPARISON:  Report from 05/16/2013-images not available FINDINGS: Cardiomegaly. Patient has history of hypertension. Negative aortic and hilar contours accounting for leftward rotation. There is artifact from EKG leads. Azygos fissure which accounts for hazy widening of the upper mediastinum. No significant osseous finding. IMPRESSION: No evidence of active disease. Cardiomegaly. Electronically Signed   By: Kathrynn DuckingJonathon  Watts M.D.  On: 08/05/2017 10:36   Ct Head Wo Contrast  Result Date: 08/04/2017 CLINICAL DATA:  Slurred speech. EXAM: CT HEAD WITHOUT CONTRAST TECHNIQUE: Contiguous axial images were obtained from the base of the skull through the vertex without intravenous contrast. COMPARISON:  None. FINDINGS: Brain: No evidence of acute infarction, hemorrhage, hydrocephalus, extra-axial collection or mass lesion/mass effect. Vascular: No hyperdense vessel or unexpected calcification. Skull: Normal. Negative for fracture or focal lesion. Sinuses/Orbits: Bilateral ethmoid and maxillary sinusitis is noted. Other: None. IMPRESSION: Bilateral ethmoid and maxillary sinusitis. No acute intracranial abnormality seen. Electronically Signed   By: Lupita Raider, M.D.   On: 08/04/2017 15:25     Medications:     . atorvastatin  80 mg Oral QHS  . benzonatate  100 mg Oral TID  . Brexpiprazole  2 mg Oral Daily  . calcium carbonate  1 tablet Oral BID WC  . docusate sodium  100 mg Oral BID  . enoxaparin (LOVENOX) injection  40 mg Subcutaneous Q24H  . escitalopram  10 mg Oral Daily  . feeding supplement (ENSURE ENLIVE)  237 mL Oral TID BM  . glimepiride  4 mg Oral Q breakfast  . insulin aspart  0-5 Units Subcutaneous QHS  . insulin aspart  0-9 Units Subcutaneous TID WC  . ISOtretinoin  20 mg Oral Daily  . linagliptin  5 mg Oral BID WC   And  . metFORMIN  500 mg Oral BID WC  . loratadine  10 mg Oral Daily  . losartan  50 mg Oral QHS  . magnesium oxide  400 mg Oral Daily  . metoprolol succinate  50 mg Oral Daily  . multivitamin with minerals  1 tablet Oral Daily  . NIFEdipine  30 mg Oral QHS  . pantoprazole  40 mg Oral Daily  . potassium chloride  40 mEq Oral Q4H  . Vitamin D (Ergocalciferol)  50,000 Units Oral Q7 days   acetaminophen **OR** acetaminophen, albuterol, chlorpheniramine-HYDROcodone, ondansetron **OR** ondansetron (ZOFRAN) IV, zolpidem  Assessment/ Plan:  Mr. Remy Voiles. is a 47 y.o.  male  Mr. Lani Mendiola. is a 47 y.o. white male with diabetes mellitus type II, hypertension, COPD/asthma, depression, obstructive sleep apnea on CPAP, hyperlipidemia, acne vulgaris and developmental delay who was admitted to [facility] on 08/04/2017 for Hypocalcemia and Hypokalemia   1. Hypocalcemia 2. Hypokalemia 3. Metabolic alkalosis 4. Hypertension 5. Diabetes mellitus type II 6. Proteinuria 7. Vitamin D deficiency  Impression Unclear why patient has symptomatic hypokalemia and hypocalcemia. Vitamin D deficiency can cause hypocalcemia but calcium normal at 8.4 on 05/13/17.  PTH at goal, 38. Phosphorus at goal. Lipase at goal  Pending SPEP/UPEP, ionized calcium, HIV - Continue ergocalciferol - Continue potassium, magnesium and calcium replacement.  - Continue Potassium  replacement - Replace magnesium - May need a further malignancy work up   LOS: 2 Lindsay Soulliere 6/8/201911:38 AM

## 2017-08-06 NOTE — Plan of Care (Signed)
  Problem: Education: Goal: Knowledge of General Education information will improve Outcome: Progressing   Problem: Coping: Goal: Level of anxiety will decrease Outcome: Progressing   Problem: Safety: Goal: Ability to remain free from injury will improve Outcome: Progressing   Problem: Clinical Measurements: Goal: Ability to maintain clinical measurements within normal limits will improve Outcome: Not Progressing

## 2017-08-06 NOTE — Progress Notes (Signed)
PT Cancellation Note  Patient Details Name: Terrilee CroakWilliam O Olivo Jr. MRN: [id number] DOB: [birth year]   Cancelled Treatment:    Reason Eval/Treat Not Completed: Medical issues which prohibited therapy(Potassium remains low.)  Calcium low as well.  Will hold PT until pt more medically appropriate for exertional activity.    Encarnacion ChuAshley Abashian PT, DPT 08/06/2017, 11:55 AM

## 2017-08-06 NOTE — Progress Notes (Addendum)
MEDICATION RELATED CONSULT NOTE   Pharmacy Consult for electrolytes Indication: hypokalemia, hypocalcemia  Allergies  Allergen Reactions  . Penicillins Anaphylaxis    Happened as a child. Has patient had a PCN reaction causing immediate rash, facial/tongue/throat swelling, SOB or lightheadedness with hypotension: Yes Has patient had a PCN reaction causing severe rash involving mucus membranes or skin necrosis: No Has patient had a PCN reaction that required hospitalization: Unknown Has patient had a PCN reaction occurring within the last 10 years: No If all of the above answers are "NO", then may proceed with Cephalosporin use.  . Sulfa Antibiotics Anaphylaxis    Other reaction(s): Unknown  . Sulfasalazine Anaphylaxis  . Azithromycin Nausea And Vomiting and Rash    Patient Measurements: Height: 6\' 1"  (185.4 cm) Weight: 247 lb 1.6 oz (112.1 kg) IBW/kg (Calculated) : 79.9  Vital Signs: Temp: 98.4 F (36.9 C) (06/08 1956) Temp Source: Oral (06/08 1956) BP: 185/73 (06/08 1956) Pulse Rate: 81 (06/08 1956) Intake/Output from previous day: 06/07 0701 - 06/08 0700 In: 237 [P.O.:237] Out: 900 [Urine:900] Intake/Output from this shift: No intake/output data recorded.  Labs: Recent Labs    08/04/17 1456 08/04/17 1944 08/05/17 0518 08/06/17 0606  WBC 15.9*  --  9.9  --   HGB 12.4*  --  11.2*  --   HCT 37.9*  --  32.9*  --   PLT 338  --  220  --   CREATININE 1.04 0.96 0.95 0.95  MG 1.6*  --  1.8 1.9  PHOS  --   --  4.6  --   ALBUMIN 3.4*  --   --  2.9*  PROT 7.4  --   --  6.5  AST 30  --   --  16  ALT 11*  --   --  10*  ALKPHOS 106  --   --  97  BILITOT 0.5  --   --  0.5   Estimated Creatinine Clearance: 126.2 mL/min (by C-G formula based on SCr of 0.95 mg/dL).   Microbiology: No results found for this or any previous visit (from the past 720 hour(s)).  Medical History: Past Medical History:  Diagnosis Date  . Acne vulgaris   . Anxiety   . Depression   .  Diabetes mellitus without complication (HCC)   . GERD (gastroesophageal reflux disease)   . Hyperlipidemia   . Hypertension   . OSA (obstructive sleep apnea)      Assessment: [age] year year old male admitted on 08/04/17 with aphasia and found to have hypokalemia and hypocalcemia. His vitamin D level is 15.9 ng/mL, PTH is WNL.   Goal of Therapy:  K 3.5-5 Mg 1.7-2.4 Ca (corrected): 8.9-10.3  Plan:  0608 Mg: 1.9, K: 2.8, corrected calcium: 7.6. Will order KCL 40mEq every 4 hours x 2 doses. Patient also Receiving KCL 20mEq twice daily. Continue PO Calcium supplementation. Will start MagOx 400mg  daily.   Will recheck K+ level at 1800.   6/8:  K @ 18:30 = 3.0 Will order KCl 10 mEq IV X 4 ordered to start 6/9 @ 20:30. Will recheck electrolytes on 6/9 @ 0200.   Scherrie Gerlachobbins,Giulio Bertino D, PharmD, Clinical Pharmacist 08/06/2017 8:13 PM

## 2017-08-07 LAB — BASIC METABOLIC PANEL
ANION GAP: 13 (ref 5–15)
Anion gap: 12 (ref 5–15)
BUN: 9 mg/dL (ref 6–20)
BUN: 9 mg/dL (ref 6–20)
CHLORIDE: 94 mmol/L — AB (ref 101–111)
CO2: 33 mmol/L — AB (ref 22–32)
CO2: 33 mmol/L — ABNORMAL HIGH (ref 22–32)
Calcium: 6.9 mg/dL — ABNORMAL LOW (ref 8.9–10.3)
Calcium: 6.9 mg/dL — ABNORMAL LOW (ref 8.9–10.3)
Chloride: 94 mmol/L — ABNORMAL LOW (ref 101–111)
Creatinine, Ser: 0.79 mg/dL (ref 0.61–1.24)
Creatinine, Ser: 0.79 mg/dL (ref 0.61–1.24)
GFR calc Af Amer: >60 mL/min (ref >60)
GFR calc Af Amer: >60 mL/min (ref >60)
GLUCOSE: 105 mg/dL — AB (ref 65–99)
Glucose, Bld: 90 mg/dL (ref 65–99)
POTASSIUM: 3.1 mmol/L — AB (ref 3.5–5.1)
POTASSIUM: 3 mmol/L — AB (ref 3.5–5.1)
SODIUM: 139 mmol/L (ref 135–145)
Sodium: 140 mmol/L (ref 135–145)

## 2017-08-07 LAB — GLUCOSE, CAPILLARY
GLUCOSE-CAPILLARY: 101 mg/dL — AB (ref 65–99)
Glucose-Capillary: 75 mg/dL (ref 65–99)

## 2017-08-07 LAB — HIV ANTIBODY (ROUTINE TESTING W REFLEX): HIV Screen 4th Generation wRfx: NONREACTIVE

## 2017-08-07 LAB — MAGNESIUM: MAGNESIUM: 1.9 mg/dL (ref 1.7–2.4)

## 2017-08-07 MED ORDER — POTASSIUM CHLORIDE CRYS ER 20 MEQ PO TBCR
20.0000 meq | EXTENDED_RELEASE_TABLET | ORAL | Status: AC
  Administered 2017-08-07 (×2): 20 meq via ORAL
  Filled 2017-08-07: qty 1

## 2017-08-07 MED ORDER — CALCIUM CARBONATE-VITAMIN D 500-200 MG-UNIT PO TABS: 1.0000 | ORAL_TABLET | Freq: Two times a day (BID) | ORAL | 3 refills | Status: DC

## 2017-08-07 MED ORDER — MAGNESIUM OXIDE 400 (241.3 MG) MG PO TABS: 400.0000 mg | ORAL_TABLET | Freq: Every day | ORAL | 0 refills | Status: DC

## 2017-08-07 MED ORDER — BENZONATATE 100 MG PO CAPS: 100.0000 mg | ORAL_CAPSULE | Freq: Three times a day (TID) | ORAL | 0 refills | Status: DC

## 2017-08-07 MED ORDER — POTASSIUM CHLORIDE ER 20 MEQ PO TBCR: 40.0000 meq | EXTENDED_RELEASE_TABLET | Freq: Every day | ORAL | 0 refills | Status: DC

## 2017-08-07 MED ORDER — VITAMIN D (ERGOCALCIFEROL) 1.25 MG (50000 UNIT) PO CAPS: 50000.0000 [IU] | ORAL_CAPSULE | ORAL | 0 refills | Status: AC

## 2017-08-07 MED ORDER — ENSURE ENLIVE PO LIQD: 237.0000 mL | Freq: Three times a day (TID) | ORAL | 12 refills | Status: DC

## 2017-08-07 NOTE — Plan of Care (Signed)
  Problem: Education: Goal: Knowledge of General Education information will improve Outcome: Progressing   Problem: Coping: Goal: Level of anxiety will decrease Outcome: Progressing   Problem: Safety: Goal: Ability to remain free from injury will improve Outcome: Progressing   

## 2017-08-07 NOTE — Progress Notes (Signed)
[facility]  ROUNDING NOTE   Subjective:   Patient with no complaints. States his cough is less productive but continues to be frequent  Objective:  Vital signs in last 24 hours:  Temp:  [98 F (36.7 C)-98.4 F (36.9 C)] 98 F (36.7 C) (06/09 0813) Pulse Rate:  [73-99] 99 (06/09 0813) Resp:  [16-18] 16 (06/09 0337) BP: (153-185)/(73-81) 165/78 (06/09 0813) SpO2:  [91 %-96 %] 91 % (06/09 0813)  Weight change:  Filed Weights   08/04/17 1442 08/04/17 1826  Weight: 122.5 kg (270 lb) 112.1 kg (247 lb 1.6 oz)    Intake/Output: I/O last 3 completed shifts: In: 237 [P.O.:237] Out: 0    Intake/Output this shift:  No intake/output data recorded.  Physical Exam: General: NAD, laying in bed  Head: Normocephalic, atraumatic. Moist oral mucosal membranes  Eyes: Anicteric, PERRL  Neck: Supple, trachea midline  Lungs:  Clear to auscultation  Heart: Regular rate and rhythm  Abdomen:  Soft, nontender,   Extremities:  no peripheral edema.  Neurologic: Nonfocal, moving all four extremities  Skin: No lesions        Basic Metabolic Panel: Recent Labs  Lab 08/04/17 1456 08/04/17 1944 08/05/17 0518 08/05/17 2222 08/06/17 0606 08/06/17 1824 08/07/17 0214 08/07/17 0513  NA 141 142 141  --  140  --  140 139  K 2.5* 2.3* 2.5* 2.6* 2.8* 3.0* 3.1* 3.0*  CL 87* 91* 94*  --  93*  --  94* 94*  CO2 36* 34* 37*  --  35*  --  33* 33*  GLUCOSE 143* 85 83  --  88  --  105* 90  BUN 10 9 9   --  9  --  9 9  CREATININE 1.04 0.96 0.95  --  0.95  --  0.79 0.79  CALCIUM 6.0* 5.9* 5.9*  --  6.7*  --  6.9* 6.9*  MG 1.6*  --  1.8  --  1.9  --   --  1.9  PHOS  --   --  4.6  --   --   --   --   --     Liver Function Tests: Recent Labs  Lab 08/04/17 1456 08/06/17 0606  AST 30 16  ALT 11* 10*  ALKPHOS 106 97  BILITOT 0.5 0.5  PROT 7.4 6.5  ALBUMIN 3.4* 2.9*   Recent Labs  Lab 08/05/17 0518  LIPASE 25   No results for input(s): AMMONIA in the last 168  hours.  CBC: Recent Labs  Lab 08/04/17 1456 08/05/17 0518  WBC 15.9* 9.9  NEUTROABS 13.4*  --   HGB 12.4* 11.2*  HCT 37.9* 32.9*  MCV 81.2 80.6  PLT 338 220    Cardiac Enzymes: Recent Labs  Lab 08/04/17 1456  TROPONINI <0.03    BNP: Invalid input(s): POCBNP  CBG: Recent Labs  Lab 08/06/17 1137 08/06/17 1726 08/06/17 2036 08/06/17 2337 08/07/17 0815  GLUCAP 122* 82 68 90 101*    Microbiology: No results found for this or any previous visit.  Coagulation Studies: No results for input(s): LABPROT, INR in the last 72 hours.  Urinalysis: Recent Labs    08/04/17 2046  COLORURINE YELLOW*  LABSPEC 1.024  PHURINE 6.0  GLUCOSEU NEGATIVE  HGBUR NEGATIVE  BILIRUBINUR NEGATIVE  KETONESUR NEGATIVE  PROTEINUR 30*  NITRITE NEGATIVE  LEUKOCYTESUR NEGATIVE      Imaging: No results found.   Medications:    . atorvastatin  80 mg Oral QHS  . benzonatate  100 mg Oral TID  . Brexpiprazole  2 mg Oral Daily  . calcium carbonate  1 tablet Oral BID WC  . docusate sodium  100 mg Oral BID  . enoxaparin (LOVENOX) injection  40 mg Subcutaneous Q24H  . escitalopram  10 mg Oral Daily  . feeding supplement (ENSURE ENLIVE)  237 mL Oral TID BM  . glimepiride  4 mg Oral Q breakfast  . insulin aspart  0-5 Units Subcutaneous QHS  . insulin aspart  0-9 Units Subcutaneous TID WC  . ISOtretinoin  20 mg Oral Daily  . linagliptin  5 mg Oral BID WC   And  . metFORMIN  500 mg Oral BID WC  . loratadine  10 mg Oral Daily  . losartan  50 mg Oral QHS  . magnesium oxide  400 mg Oral Daily  . metoprolol succinate  50 mg Oral Daily  . multivitamin with minerals  1 tablet Oral Daily  . NIFEdipine  30 mg Oral QHS  . pantoprazole  40 mg Oral Daily  . Vitamin D (Ergocalciferol)  50,000 Units Oral Q7 days   acetaminophen **OR** acetaminophen, albuterol, chlorpheniramine-HYDROcodone, ondansetron **OR** ondansetron (ZOFRAN) IV, zolpidem  Assessment/ Plan:  Mr. [name] [name].  is a 47 y.o.  male  Mr. [name] [name]. is a 47 y.o. white male with diabetes mellitus type II, hypertension, COPD/asthma, depression, obstructive sleep apnea on CPAP, hyperlipidemia, acne vulgaris and developmental delay who was admitted to [facility] on 08/04/2017 for Hypocalcemia and Hypokalemia   1. Hypocalcemia 2. Hypokalemia 3. Metabolic alkalosis 4. Hypertension 5. Diabetes mellitus type II 6. Proteinuria 7. Vitamin D deficiency  Impression Unclear why patient has symptomatic hypokalemia and hypocalcemia. Vitamin D deficiency can cause hypocalcemia but calcium normal at 8.4 on 05/13/17.  PTH at goal, 38. Phosphorus at goal. Lipase at goal. HIV negative Pending SPEP/UPEP, ionized calcium,  - Continue ergocalciferol - Continue calcium replacement - Continue potassium, magnesium replacement.    LOS: 3 [name] [name] 6/9/201911:32 AM

## 2017-08-07 NOTE — Progress Notes (Signed)
Pharmacy consulted for electrolyte replacement protocol:   Goal of therapy: Electrolytes within normal limits:  K 3.5 - 5.1 Corrected Ca 8.9 - 10.3 Phos 2.5 - 4.6 Mg 1.7 - 2.4   Assessment: Lab Results  Component Value Date   CREATININE 0.79 08/07/2017   BUN 9 08/07/2017   NA 139 08/07/2017   K 3.0 (L) 08/07/2017   CL 94 (L) 08/07/2017   CO2 33 (H) 08/07/2017    Plan: K 3.0 Potassium Chloride 20meq po q2h x 2 doses per protocol. Will recheck at 1800 per pharmacy protocol. Magnesium 1.9, will not recheck at this next lab.    Luan PullingGarrett Jacquita Mulhearn, PharmD, MBA, Liz ClaiborneBCGP Clinical Pharmacist [facility]

## 2017-08-07 NOTE — Progress Notes (Signed)
PT Cancellation Note  Patient Details Name: [name] [name]. MRN: [id number] DOB: [birth year]   Cancelled Treatment:    Reason Eval/Treat Not Completed: PT screened, no needs identified, will sign off.  Spoke with RN who spoke with pt who declined PT saying, "I don't think I need it".  Per RN the pt has been ambulating in room without any signs of instability and with no use of AD.  No skilled PT needs identified and pt declines PT.  PT will sign off at this time.    Encarnacion ChuAshley Avonlea Sima PT, DPT 08/07/2017, 10:46 AM

## 2017-08-07 NOTE — Progress Notes (Signed)
RN removed both IVs.  Patient went home with his wife in a private vehicle.  Christean GriefShylon M Jadalynn Burr, RN

## 2017-08-07 NOTE — Discharge Summary (Signed)
[facility] [place] [facility]   PATIENT NAME: [name] [name]    MR#:  [id number]  DATE OF BIRTH:  07-02-1970  DATE OF ADMISSION:  08/04/2017 ADMITTING PHYSICIAN: Enid Baas, MD  DATE OF DISCHARGE: 08/07/2017  PRIMARY CARE PHYSICIAN: Marguarite Arbour, MD    ADMISSION DIAGNOSIS:  Hypocalcemia [E83.51] Hypokalemia [E87.6]  DISCHARGE DIAGNOSIS:  Active Problems:   Hypokalemia   Hypocalcemia   Hypomagnesemia   SECONDARY DIAGNOSIS:   Past Medical History:  Diagnosis Date  . Acne vulgaris   . Anxiety   . Depression   . Diabetes mellitus without complication (HCC)   . GERD (gastroesophageal reflux disease)   . Hyperlipidemia   . Hypertension   . OSA (obstructive sleep apnea)     HOSPITAL COURSE:   47 year old male with a history of diabetes and depression who presented with slurred speech and muscle spasms.   1.  Severe electrolyte abnormalities including low potassium and calcium: Vitamin D level is low and therefore vitamin D supplementation has been started. Electrolytes were repleted.  He will be discharged on oral potassium and calcium supplementation in addition to vitamin D.  He will have follow-up with his PCP for repeat BMP.     2.  Leukocytosis due to prednisone as an outpatient Blood cell count improved 3.  Cough: X-ray showed no evidence of bronchitis or pneumonia. His cough has improved.   4.  Essential hypertension: Continue losartan and metoprolol and nifedipine  5.  Diabetes: Continue outpatient medications with ADA diet  6.  Depression: Continue  Lexapro     DISCHARGE CONDITIONS AND DIET:   Stable for discharge on diabetic diet  CONSULTS OBTAINED:  Treatment Team:  Lamont Dowdy, MD  DRUG ALLERGIES:   Allergies  Allergen Reactions  . Penicillins Anaphylaxis    Happened as a child. Has patient had a PCN reaction causing immediate rash, facial/tongue/throat swelling, SOB or lightheadedness with  hypotension: Yes Has patient had a PCN reaction causing severe rash involving mucus membranes or skin necrosis: No Has patient had a PCN reaction that required hospitalization: Unknown Has patient had a PCN reaction occurring within the last 10 years: No If all of the above answers are "NO", then may proceed with Cephalosporin use.  . Sulfa Antibiotics Anaphylaxis    Other reaction(s): Unknown  . Sulfasalazine Anaphylaxis  . Azithromycin Nausea And Vomiting and Rash    DISCHARGE MEDICATIONS:   Allergies as of 08/07/2017      Reactions   Penicillins Anaphylaxis   Happened as a child. Has patient had a PCN reaction causing immediate rash, facial/tongue/throat swelling, SOB or lightheadedness with hypotension: Yes Has patient had a PCN reaction causing severe rash involving mucus membranes or skin necrosis: No Has patient had a PCN reaction that required hospitalization: Unknown Has patient had a PCN reaction occurring within the last 10 years: No If all of the above answers are "NO", then may proceed with Cephalosporin use.   Sulfa Antibiotics Anaphylaxis   Other reaction(s): Unknown   Sulfasalazine Anaphylaxis   Azithromycin Nausea And Vomiting, Rash      Medication List    STOP taking these medications   levofloxacin 500 MG tablet Commonly known as:  LEVAQUIN   predniSONE 10 MG tablet Commonly known as:  DELTASONE     TAKE these medications   albuterol 108 (90 Base) MCG/ACT inhaler Commonly known as:  PROVENTIL HFA;VENTOLIN HFA Inhale 2 puffs into the lungs every 6 (six) hours as needed for wheezing  or shortness of breath.   atorvastatin 80 MG tablet Commonly known as:  LIPITOR Take 80 mg by mouth at bedtime.   benzonatate 100 MG capsule Commonly known as:  TESSALON Take 1 capsule (100 mg total) by mouth 3 (three) times daily.   cetirizine 10 MG tablet Commonly known as:  ZYRTEC Take 10 mg by mouth at bedtime.   escitalopram 10 MG tablet Commonly known as:   LEXAPRO Take 10 mg by mouth daily.   feeding supplement (ENSURE ENLIVE) Liqd Take 237 mLs by mouth 3 (three) times daily between meals.   glimepiride 4 MG tablet Commonly known as:  AMARYL Take 4 mg by mouth daily with breakfast.   ISOtretinoin 20 MG capsule Commonly known as:  ACCUTANE Take 20 mg by mouth daily.   losartan 50 MG tablet Commonly known as:  COZAAR Take 50 mg by mouth at bedtime.   magnesium oxide 400 (241.3 Mg) MG tablet Commonly known as:  MAG-OX Take 1 tablet (400 mg total) by mouth daily. Start taking on:  08/08/2017   metoprolol succinate 50 MG 24 hr tablet Commonly known as:  TOPROL-XL Take 50 mg by mouth daily. Take with or immediately following a meal.   NIFEdipine 30 MG 24 hr tablet Commonly known as:  PROCARDIA-XL/ADALAT CC Take 30 mg by mouth at bedtime.   omeprazole 40 MG capsule Commonly known as:  PRILOSEC Take 40 mg by mouth daily.   ondansetron 4 MG tablet Commonly known as:  ZOFRAN Take 4 mg by mouth every 8 (eight) hours as needed for nausea.   Potassium Chloride ER 20 MEQ Tbcr Take 40 mEq by mouth daily for 4 days.   REXULTI 2 MG Tabs Generic drug:  Brexpiprazole Take 2 mg by mouth daily.   sitaGLIPtin-metformin 50-500 MG tablet Commonly known as:  JANUMET Take 1 tablet by mouth 2 (two) times daily with a meal.   Vitamin D (Ergocalciferol) 50000 units Caps capsule Commonly known as:  DRISDOL Take 1 capsule (50,000 Units total) by mouth every 7 (seven) days. Start taking on:  08/12/2017         Today   CHIEF COMPLAINT:  Patient is doing well this morning.  No weakness or acute issues overnight   VITAL SIGNS:  Blood pressure (!) 165/78, pulse 99, temperature 98 F (36.7 C), temperature source Oral, resp. rate 16, height 6\' 1"  (1.854 m), weight 112.1 kg (247 lb 1.6 oz), SpO2 91 %.   REVIEW OF SYSTEMS:  Review of Systems  Constitutional: Negative.  Negative for chills, fever and malaise/fatigue.  HENT: Negative.   Negative for ear discharge, ear pain, hearing loss, nosebleeds and sore throat.   Eyes: Negative.  Negative for blurred vision and pain.  Respiratory: Negative.  Negative for cough, hemoptysis, shortness of breath and wheezing.   Cardiovascular: Negative.  Negative for chest pain, palpitations and leg swelling.  Gastrointestinal: Negative.  Negative for abdominal pain, blood in stool, diarrhea, nausea and vomiting.  Genitourinary: Negative.  Negative for dysuria.  Musculoskeletal: Negative.  Negative for back pain.  Skin: Negative.   Neurological: Negative for dizziness, tremors, speech change, focal weakness, seizures and headaches.  Endo/Heme/Allergies: Negative.  Does not bruise/bleed easily.  Psychiatric/Behavioral: Negative.  Negative for depression, hallucinations and suicidal ideas.     PHYSICAL EXAMINATION:  GENERAL:  47 y.o.-year-old patient lying in the bed with no acute distress.  NECK:  Supple, no jugular venous distention. No thyroid enlargement, no tenderness.  LUNGS: Normal breath sounds bilaterally, no  wheezing, rales,rhonchi  No use of accessory muscles of respiration.  CARDIOVASCULAR: S1, S2 normal. No murmurs, rubs, or gallops.  ABDOMEN: Soft, non-tender, non-distended. Bowel sounds present. No organomegaly or mass.  EXTREMITIES: No pedal edema, cyanosis, or clubbing.  PSYCHIATRIC: The patient is alert and oriented x 3.  SKIN: No obvious rash, lesion, or ulcer.   DATA REVIEW:   CBC Recent Labs  Lab 08/05/17 0518  WBC 9.9  HGB 11.2*  HCT 32.9*  PLT 220    Chemistries  Recent Labs  Lab 08/06/17 0606  08/07/17 0513  NA 140   < > 139  K 2.8*   < > 3.0*  CL 93*   < > 94*  CO2 35*   < > 33*  GLUCOSE 88   < > 90  BUN 9   < > 9  CREATININE 0.95   < > 0.79  CALCIUM 6.7*   < > 6.9*  MG 1.9  --  1.9  AST 16  --   --   ALT 10*  --   --   ALKPHOS 97  --   --   BILITOT 0.5  --   --    < > = values in this interval not displayed.    Cardiac Enzymes Recent  Labs  Lab 08/04/17 1456  TROPONINI <0.03    Microbiology Results  @[email or web address] @  RADIOLOGY:  Dg Chest 1 View  Result Date: 08/05/2017 CLINICAL DATA:  Cough EXAM: CHEST  1 VIEW COMPARISON:  Report from 05/16/2013-images not available FINDINGS: Cardiomegaly. Patient has history of hypertension. Negative aortic and hilar contours accounting for leftward rotation. There is artifact from EKG leads. Azygos fissure which accounts for hazy widening of the upper mediastinum. No significant osseous finding. IMPRESSION: No evidence of active disease. Cardiomegaly. Electronically Signed   By: Marnee Spring M.D.   On: 08/05/2017 10:36      Allergies as of 08/07/2017      Reactions   Penicillins Anaphylaxis   Happened as a child. Has patient had a PCN reaction causing immediate rash, facial/tongue/throat swelling, SOB or lightheadedness with hypotension: Yes Has patient had a PCN reaction causing severe rash involving mucus membranes or skin necrosis: No Has patient had a PCN reaction that required hospitalization: Unknown Has patient had a PCN reaction occurring within the last 10 years: No If all of the above answers are "NO", then may proceed with Cephalosporin use.   Sulfa Antibiotics Anaphylaxis   Other reaction(s): Unknown   Sulfasalazine Anaphylaxis   Azithromycin Nausea And Vomiting, Rash      Medication List    STOP taking these medications   levofloxacin 500 MG tablet Commonly known as:  LEVAQUIN   predniSONE 10 MG tablet Commonly known as:  DELTASONE     TAKE these medications   albuterol 108 (90 Base) MCG/ACT inhaler Commonly known as:  PROVENTIL HFA;VENTOLIN HFA Inhale 2 puffs into the lungs every 6 (six) hours as needed for wheezing or shortness of breath.   atorvastatin 80 MG tablet Commonly known as:  LIPITOR Take 80 mg by mouth at bedtime.   benzonatate 100 MG capsule Commonly known as:  TESSALON Take 1 capsule (100 mg total) by mouth 3 (three) times daily.    cetirizine 10 MG tablet Commonly known as:  ZYRTEC Take 10 mg by mouth at bedtime.   escitalopram 10 MG tablet Commonly known as:  LEXAPRO Take 10 mg by mouth daily.   feeding supplement (ENSURE ENLIVE) Liqd Take 237  mLs by mouth 3 (three) times daily between meals.   glimepiride 4 MG tablet Commonly known as:  AMARYL Take 4 mg by mouth daily with breakfast.   ISOtretinoin 20 MG capsule Commonly known as:  ACCUTANE Take 20 mg by mouth daily.   losartan 50 MG tablet Commonly known as:  COZAAR Take 50 mg by mouth at bedtime.   magnesium oxide 400 (241.3 Mg) MG tablet Commonly known as:  MAG-OX Take 1 tablet (400 mg total) by mouth daily. Start taking on:  08/08/2017   metoprolol succinate 50 MG 24 hr tablet Commonly known as:  TOPROL-XL Take 50 mg by mouth daily. Take with or immediately following a meal.   NIFEdipine 30 MG 24 hr tablet Commonly known as:  PROCARDIA-XL/ADALAT CC Take 30 mg by mouth at bedtime.   omeprazole 40 MG capsule Commonly known as:  PRILOSEC Take 40 mg by mouth daily.   ondansetron 4 MG tablet Commonly known as:  ZOFRAN Take 4 mg by mouth every 8 (eight) hours as needed for nausea.   Potassium Chloride ER 20 MEQ Tbcr Take 40 mEq by mouth daily for 4 days.   REXULTI 2 MG Tabs Generic drug:  Brexpiprazole Take 2 mg by mouth daily.   sitaGLIPtin-metformin 50-500 MG tablet Commonly known as:  JANUMET Take 1 tablet by mouth 2 (two) times daily with a meal.   Vitamin D (Ergocalciferol) 50000 units Caps capsule Commonly known as:  DRISDOL Take 1 capsule (50,000 Units total) by mouth every 7 (seven) days. Start taking on:  08/12/2017          Management plans discussed with the patient and he is in agreement. Stable for discharge home  Patient should follow up with pcp  CODE STATUS:     Code Status Orders  (From admission, onward)        Start     Ordered   08/04/17 1826  Full code  Continuous     08/04/17 1825    Code  Status History    This patient has a current code status but no historical code status.      TOTAL TIME TAKING CARE OF THIS PATIENT: 38 minutes.    Note: This dictation was prepared with Dragon dictation along with smaller phrase technology. Any transcriptional errors that result from this process are unintentional.  Marlane Hirschmann M.D on 08/07/2017 at 10:23 AM  Between 7am to 6pm - Pager - [phone] After 6pm go to [email or web address] - Social research officer, governmentpassword [email or web address]  [facility]  Office  [phone]  CC: Primary care physician; Marguarite ArbourSparks, Jeffrey D, MD

## 2017-08-08 LAB — PROTEIN ELECTROPHORESIS, SERUM
A/G RATIO SPE: 0.8 (ref 0.7–1.7)
ALPHA-2-GLOBULIN: 1.1 g/dL — AB (ref 0.4–1.0)
Albumin ELP: 2.6 g/dL — ABNORMAL LOW (ref 2.9–4.4)
Alpha-1-Globulin: 0.3 g/dL (ref 0.0–0.4)
BETA GLOBULIN: 1.1 g/dL (ref 0.7–1.3)
Gamma Globulin: 0.8 g/dL (ref 0.4–1.8)
Globulin, Total: 3.2 g/dL (ref 2.2–3.9)
Total Protein ELP: 5.8 g/dL — ABNORMAL LOW (ref 6.0–8.5)

## 2017-08-08 LAB — KAPPA/LAMBDA LIGHT CHAINS
KAPPA, LAMDA LIGHT CHAIN RATIO: 1.63 (ref 0.26–1.65)
Kappa free light chain: 30.9 mg/L — ABNORMAL HIGH (ref 3.3–19.4)
Lambda free light chains: 19 mg/L (ref 5.7–26.3)

## 2017-08-08 LAB — PROTEIN ELECTRO, RANDOM URINE
ALPHA-1-GLOBULIN, U: 5.5 %
ALPHA-2-GLOBULIN, U: 14.8 %
Albumin ELP, Urine: 33.4 %
Beta Globulin, U: 27 %
GAMMA GLOBULIN, U: 19.3 %
Total Protein, Urine: 52.5 mg/dL

## 2017-08-08 LAB — CALCIUM, IONIZED: Calcium, Ionized, Serum: 3.8 mg/dL — ABNORMAL LOW (ref 4.5–5.6)

## 2018-03-29 DIAGNOSIS — E118 Type 2 diabetes mellitus with unspecified complications: Secondary | ICD-10-CM | POA: Insufficient documentation

## 2018-06-26 ENCOUNTER — Other Ambulatory Visit: Payer: Self-pay | Admitting: Internal Medicine

## 2018-06-26 ENCOUNTER — Ambulatory Visit
Admission: RE | Admit: 2018-06-26 | Discharge: 2018-06-26 | Disposition: A | Discharge status: Home / Self Care | Payer: BC Managed Care – PPO | Source: Ambulatory Visit | Attending: Internal Medicine | Admitting: Internal Medicine

## 2018-06-26 ENCOUNTER — Encounter: Payer: Self-pay | Admitting: Emergency Medicine

## 2018-06-26 ENCOUNTER — Other Ambulatory Visit: Payer: Self-pay

## 2018-06-26 ENCOUNTER — Inpatient Hospital Stay
Admission: EM | Admit: 2018-06-26 | Discharge: 2018-07-01 | DRG: 373 | Disposition: A | Discharge status: Home / Self Care | Payer: Medicare Other | Attending: Surgery | Admitting: Surgery

## 2018-06-26 DIAGNOSIS — Z881 Allergy status to other antibiotic agents status: Secondary | ICD-10-CM | POA: Diagnosis not present

## 2018-06-26 DIAGNOSIS — K3533 Acute appendicitis with perforation and localized peritonitis, with abscess: Secondary | ICD-10-CM | POA: Diagnosis present

## 2018-06-26 DIAGNOSIS — K3580 Unspecified acute appendicitis: Secondary | ICD-10-CM | POA: Diagnosis present

## 2018-06-26 DIAGNOSIS — Z880 Allergy status to penicillin: Secondary | ICD-10-CM | POA: Diagnosis not present

## 2018-06-26 DIAGNOSIS — Z7984 Long term (current) use of oral hypoglycemic drugs: Secondary | ICD-10-CM | POA: Diagnosis not present

## 2018-06-26 DIAGNOSIS — E119 Type 2 diabetes mellitus without complications: Secondary | ICD-10-CM | POA: Diagnosis present

## 2018-06-26 DIAGNOSIS — E785 Hyperlipidemia, unspecified: Secondary | ICD-10-CM | POA: Diagnosis present

## 2018-06-26 DIAGNOSIS — G4733 Obstructive sleep apnea (adult) (pediatric): Secondary | ICD-10-CM | POA: Diagnosis present

## 2018-06-26 DIAGNOSIS — K219 Gastro-esophageal reflux disease without esophagitis: Secondary | ICD-10-CM | POA: Diagnosis present

## 2018-06-26 DIAGNOSIS — Z882 Allergy status to sulfonamides status: Secondary | ICD-10-CM

## 2018-06-26 DIAGNOSIS — F419 Anxiety disorder, unspecified: Secondary | ICD-10-CM | POA: Diagnosis present

## 2018-06-26 DIAGNOSIS — Z79899 Other long term (current) drug therapy: Secondary | ICD-10-CM | POA: Diagnosis not present

## 2018-06-26 DIAGNOSIS — R1084 Generalized abdominal pain: Secondary | ICD-10-CM | POA: Insufficient documentation

## 2018-06-26 DIAGNOSIS — R1031 Right lower quadrant pain: Secondary | ICD-10-CM | POA: Diagnosis not present

## 2018-06-26 DIAGNOSIS — F329 Major depressive disorder, single episode, unspecified: Secondary | ICD-10-CM | POA: Diagnosis present

## 2018-06-26 DIAGNOSIS — I10 Essential (primary) hypertension: Secondary | ICD-10-CM | POA: Diagnosis present

## 2018-06-26 LAB — CBC WITH DIFFERENTIAL/PLATELET
Abs Immature Granulocytes: 0.33 10*3/uL — ABNORMAL HIGH (ref 0.00–0.07)
Basophils Absolute: 0.1 10*3/uL (ref 0.0–0.1)
Basophils Relative: 1 %
Eosinophils Absolute: 0.3 10*3/uL (ref 0.0–0.5)
Eosinophils Relative: 2 %
HCT: 34.5 % — ABNORMAL LOW (ref 39.0–52.0)
Hemoglobin: 11 g/dL — ABNORMAL LOW (ref 13.0–17.0)
Immature Granulocytes: 2 %
Lymphocytes Relative: 9 %
Lymphs Abs: 1.4 10*3/uL (ref 0.7–4.0)
MCH: 26.9 pg (ref 26.0–34.0)
MCHC: 31.9 g/dL (ref 30.0–36.0)
MCV: 84.4 fL (ref 80.0–100.0)
Monocytes Absolute: 0.9 10*3/uL (ref 0.1–1.0)
Monocytes Relative: 6 %
Neutro Abs: 12.3 10*3/uL — ABNORMAL HIGH (ref 1.7–7.7)
Neutrophils Relative %: 80 %
Platelets: 273 10*3/uL (ref 150–400)
RBC: 4.09 MIL/uL — ABNORMAL LOW (ref 4.22–5.81)
RDW: 14.6 % (ref 11.5–15.5)
WBC: 15.4 10*3/uL — ABNORMAL HIGH (ref 4.0–10.5)
nRBC: 0 % (ref 0.0–0.2)

## 2018-06-26 LAB — COMPREHENSIVE METABOLIC PANEL
ALT: 19 U/L (ref 0–44)
AST: 21 U/L (ref 15–41)
Albumin: 3.2 g/dL — ABNORMAL LOW (ref 3.5–5.0)
Alkaline Phosphatase: 103 U/L (ref 38–126)
Anion gap: 14 (ref 5–15)
BUN: 12 mg/dL (ref 6–20)
CO2: 27 mmol/L (ref 22–32)
Calcium: 8.2 mg/dL — ABNORMAL LOW (ref 8.9–10.3)
Chloride: 95 mmol/L — ABNORMAL LOW (ref 98–111)
Creatinine, Ser: 1.21 mg/dL (ref 0.61–1.24)
GFR calc Af Amer: >60 mL/min (ref >60)
GFR calc non Af Amer: >60 mL/min (ref >60)
Glucose, Bld: 189 mg/dL — ABNORMAL HIGH (ref 70–99)
Potassium: 3.9 mmol/L (ref 3.5–5.1)
Sodium: 136 mmol/L (ref 135–145)
Total Bilirubin: 0.4 mg/dL (ref 0.3–1.2)
Total Protein: 8.1 g/dL (ref 6.5–8.1)

## 2018-06-26 LAB — GLUCOSE, CAPILLARY: Glucose-Capillary: 115 mg/dL — ABNORMAL HIGH (ref 70–99)

## 2018-06-26 LAB — POCT I-STAT CREATININE: Creatinine, Ser: 1.2 mg/dL (ref 0.61–1.24)

## 2018-06-26 MED ORDER — ONDANSETRON 4 MG PO TBDP
4.0000 mg | ORAL_TABLET | Freq: Four times a day (QID) | ORAL | Status: DC | PRN
  Administered 2018-06-28: 4 mg via ORAL
  Filled 2018-06-26 (×2): qty 1

## 2018-06-26 MED ORDER — PANTOPRAZOLE SODIUM 40 MG IV SOLR
40.0000 mg | Freq: Every day | INTRAVENOUS | Status: DC
  Administered 2018-06-26 – 2018-06-30 (×5): 40 mg via INTRAVENOUS
  Filled 2018-06-26 (×5): qty 40

## 2018-06-26 MED ORDER — LACTATED RINGERS IV SOLN
125.0000 mL/h | INTRAVENOUS | Status: DC
  Administered 2018-06-26 – 2018-06-27 (×4): 125 mL/h via INTRAVENOUS

## 2018-06-26 MED ORDER — ACETAMINOPHEN 500 MG PO TABS
1000.0000 mg | ORAL_TABLET | Freq: Four times a day (QID) | ORAL | Status: DC
  Administered 2018-06-27 – 2018-07-01 (×13): 1000 mg via ORAL
  Filled 2018-06-26 (×16): qty 2

## 2018-06-26 MED ORDER — CIPROFLOXACIN IN D5W 400 MG/200ML IV SOLN
400.0000 mg | Freq: Two times a day (BID) | INTRAVENOUS | Status: DC
  Administered 2018-06-27 – 2018-07-01 (×9): 400 mg via INTRAVENOUS
  Filled 2018-06-26 (×10): qty 200

## 2018-06-26 MED ORDER — ALBUTEROL SULFATE (2.5 MG/3ML) 0.083% IN NEBU: 2.0000 mL | INHALATION_SOLUTION | Freq: Four times a day (QID) | RESPIRATORY_TRACT | Status: DC | PRN

## 2018-06-26 MED ORDER — POLYETHYLENE GLYCOL 3350 17 G PO PACK: 17.0000 g | PACK | Freq: Every day | ORAL | Status: DC | PRN

## 2018-06-26 MED ORDER — ZOLPIDEM TARTRATE 5 MG PO TABS
5.0000 mg | ORAL_TABLET | Freq: Every evening | ORAL | Status: DC | PRN
  Administered 2018-06-27 – 2018-06-30 (×5): 5 mg via ORAL
  Filled 2018-06-26 (×5): qty 1

## 2018-06-26 MED ORDER — IOHEXOL 300 MG/ML  SOLN
125.0000 mL | Freq: Once | INTRAMUSCULAR | Status: AC | PRN
  Administered 2018-06-26: 125 mL via INTRAVENOUS

## 2018-06-26 MED ORDER — HYDROMORPHONE HCL 1 MG/ML IJ SOLN: 0.5000 mg | INTRAMUSCULAR | Status: DC | PRN

## 2018-06-26 MED ORDER — LOSARTAN POTASSIUM 50 MG PO TABS
50.0000 mg | ORAL_TABLET | Freq: Every day | ORAL | Status: DC
  Administered 2018-06-26 – 2018-06-30 (×5): 50 mg via ORAL
  Filled 2018-06-26 (×5): qty 1

## 2018-06-26 MED ORDER — ESCITALOPRAM OXALATE 10 MG PO TABS
10.0000 mg | ORAL_TABLET | Freq: Every day | ORAL | Status: DC
  Administered 2018-06-27 – 2018-07-01 (×5): 10 mg via ORAL
  Filled 2018-06-26 (×6): qty 1

## 2018-06-26 MED ORDER — METRONIDAZOLE IN NACL 5-0.79 MG/ML-% IV SOLN
500.0000 mg | Freq: Once | INTRAVENOUS | Status: AC
  Administered 2018-06-26: 500 mg via INTRAVENOUS
  Filled 2018-06-26: qty 100

## 2018-06-26 MED ORDER — NIFEDIPINE ER OSMOTIC RELEASE 30 MG PO TB24
30.0000 mg | ORAL_TABLET | Freq: Every day | ORAL | Status: DC
  Administered 2018-06-26 – 2018-06-30 (×5): 30 mg via ORAL
  Filled 2018-06-26 (×6): qty 1

## 2018-06-26 MED ORDER — ONDANSETRON HCL 4 MG/2ML IJ SOLN
INTRAMUSCULAR | Status: AC
  Administered 2018-06-26: 4 mg via INTRAVENOUS
  Filled 2018-06-26: qty 2

## 2018-06-26 MED ORDER — METOPROLOL SUCCINATE ER 50 MG PO TB24
50.0000 mg | ORAL_TABLET | Freq: Every day | ORAL | Status: DC
  Administered 2018-06-27 – 2018-07-01 (×5): 50 mg via ORAL
  Filled 2018-06-26 (×5): qty 1

## 2018-06-26 MED ORDER — ONDANSETRON HCL 4 MG/2ML IJ SOLN
4.0000 mg | Freq: Four times a day (QID) | INTRAMUSCULAR | Status: DC | PRN
  Administered 2018-06-27 – 2018-07-01 (×5): 4 mg via INTRAVENOUS
  Filled 2018-06-26 (×5): qty 2

## 2018-06-26 MED ORDER — ENOXAPARIN SODIUM 40 MG/0.4ML ~~LOC~~ SOLN
40.0000 mg | Freq: Two times a day (BID) | SUBCUTANEOUS | Status: DC
  Administered 2018-06-26 – 2018-06-30 (×9): 40 mg via SUBCUTANEOUS
  Filled 2018-06-26 (×10): qty 0.4

## 2018-06-26 MED ORDER — KETOROLAC TROMETHAMINE 30 MG/ML IJ SOLN
30.0000 mg | Freq: Four times a day (QID) | INTRAMUSCULAR | Status: DC
  Administered 2018-06-27 – 2018-06-30 (×15): 30 mg via INTRAVENOUS
  Filled 2018-06-26 (×16): qty 1

## 2018-06-26 MED ORDER — ONDANSETRON HCL 4 MG/2ML IJ SOLN
4.0000 mg | Freq: Once | INTRAMUSCULAR | Status: AC
  Administered 2018-06-26: 4 mg via INTRAVENOUS

## 2018-06-26 MED ORDER — INSULIN ASPART 100 UNIT/ML ~~LOC~~ SOLN
0.0000 [IU] | SUBCUTANEOUS | Status: DC
  Administered 2018-06-27: 13:00:00 4 [IU] via SUBCUTANEOUS
  Administered 2018-06-27: 1 [IU] via SUBCUTANEOUS
  Administered 2018-06-27 – 2018-06-28 (×2): 3 [IU] via SUBCUTANEOUS
  Administered 2018-06-28: 7 [IU] via SUBCUTANEOUS
  Administered 2018-06-28: 3 [IU] via SUBCUTANEOUS
  Administered 2018-06-29 (×2): 4 [IU] via SUBCUTANEOUS
  Filled 2018-06-26 (×9): qty 1

## 2018-06-26 MED ORDER — CIPROFLOXACIN IN D5W 400 MG/200ML IV SOLN
400.0000 mg | Freq: Once | INTRAVENOUS | Status: AC
  Administered 2018-06-26: 400 mg via INTRAVENOUS
  Filled 2018-06-26: qty 200

## 2018-06-26 MED ORDER — OXYCODONE HCL 5 MG PO TABS: 5.0000 mg | ORAL_TABLET | ORAL | Status: DC | PRN

## 2018-06-26 MED ORDER — BREXPIPRAZOLE 1 MG PO TABS
2.0000 mg | ORAL_TABLET | Freq: Every day | ORAL | Status: DC
  Administered 2018-06-27 – 2018-07-01 (×4): 2 mg via ORAL
  Filled 2018-06-26 (×6): qty 2

## 2018-06-26 MED ORDER — METRONIDAZOLE IN NACL 5-0.79 MG/ML-% IV SOLN
500.0000 mg | Freq: Three times a day (TID) | INTRAVENOUS | Status: DC
  Administered 2018-06-27 – 2018-07-01 (×13): 500 mg via INTRAVENOUS
  Filled 2018-06-26 (×16): qty 100

## 2018-06-26 NOTE — ED Provider Notes (Signed)
[facility] Emergency Department Provider Note  ____________________________________________   First MD Initiated Contact with Patient 06/26/18 1731     (approximate)  I have reviewed the triage vital signs and the nursing notes.   HISTORY  Chief Complaint Abdominal Pain    HPI Terrilee CroakWilliam O Duda Jr. is a 48 y.o. male presents emergency department after being evaluated by CT.  Patient's doctor called in a CT for abdominal pain.  Patient states he has had abdominal pain for 2 weeks with increasing right lower quadrant pain last night and a low-grade fever for 2 weeks.  He denies any vomiting or diarrhea prior to arrival.  He denies any chest pain or shortness of breath.    Past Medical History:  Diagnosis Date  . Acne vulgaris   . Anxiety   . Depression   . Diabetes mellitus without complication (HCC)   . GERD (gastroesophageal reflux disease)   . Hyperlipidemia   . Hypertension   . OSA (obstructive sleep apnea)     Patient Active Problem List   Diagnosis Date Noted  . Acute appendicitis 06/26/2018  . Hypomagnesemia 08/06/2017  . Hypocalcemia 08/05/2017  . Hypokalemia 08/04/2017    Past Surgical History:  Procedure Laterality Date  . Gum Graft      Prior to Admission medications   Medication Sig Start Date End Date Taking? Authorizing Provider  albuterol (PROVENTIL HFA;VENTOLIN HFA) 108 (90 Base) MCG/ACT inhaler Inhale 2 puffs into the lungs every 6 (six) hours as needed for wheezing or shortness of breath.    [provider]  atorvastatin (LIPITOR) 80 MG tablet Take 80 mg by mouth at bedtime.     [provider]  benzonatate (TESSALON) 100 MG capsule Take 1 capsule (100 mg total) by mouth 3 (three) times daily. 08/07/17   [name], [name], MD  Brexpiprazole (REXULTI) 2 MG TABS Take 2 mg by mouth daily.     [provider]  calcium-vitamin D (OSCAL 500/200 D-3) 500-200 MG-UNIT tablet Take 1 tablet by mouth 2 (two) times  daily. 08/07/17 08/07/18  [name], [name], MD  cetirizine (ZYRTEC) 10 MG tablet Take 10 mg by mouth at bedtime.     [provider]  escitalopram (LEXAPRO) 10 MG tablet Take 10 mg by mouth daily.    [provider]  feeding supplement, ENSURE ENLIVE, (ENSURE ENLIVE) LIQD Take 237 mLs by mouth 3 (three) times daily between meals. 08/07/17   [name], [name], MD  glimepiride (AMARYL) 4 MG tablet Take 4 mg by mouth daily with breakfast.    [provider]  ISOtretinoin (ACCUTANE) 20 MG capsule Take 20 mg by mouth daily.    [provider]  losartan (COZAAR) 50 MG tablet Take 50 mg by mouth at bedtime.     [provider]  magnesium oxide (MAG-OX) 400 (241.3 Mg) MG tablet Take 1 tablet (400 mg total) by mouth daily. 08/08/17   [name], [name], MD  metoprolol succinate (TOPROL-XL) 50 MG 24 hr tablet Take 50 mg by mouth daily. Take with or immediately following a meal.    [provider]  NIFEdipine (PROCARDIA-XL/ADALAT CC) 30 MG 24 hr tablet Take 30 mg by mouth at bedtime.     [provider]  omeprazole (PRILOSEC) 40 MG capsule Take 40 mg by mouth daily.    [provider]  ondansetron (ZOFRAN) 4 MG tablet Take 4 mg by mouth every 8 (eight) hours as needed for nausea. 07/29/17   [provider]  potassium chloride 20 MEQ TBCR Take 40 mEq by mouth daily for 4 days. 08/07/17 08/11/17  Adrian Saran, MD  sitaGLIPtin-metformin (JANUMET) 50-500 MG tablet Take 1 tablet by mouth 2 (two) times daily with a meal.    [provider]  Vitamin D, Ergocalciferol, (DRISDOL) 50000 units CAPS capsule Take 1 capsule (50,000 Units total) by mouth every 7 (seven) days. 08/12/17   Adrian Saran, MD    Allergies Penicillins; Sulfa antibiotics; Sulfasalazine; and Azithromycin  Family History  Problem Relation Age of Onset  . Colon cancer Mother   . CAD Father   . Diabetes Sister     Social History Social History   Tobacco Use  . Smoking status: Never  Smoker  . Smokeless tobacco: Never Used  Substance Use Topics  . Alcohol use: No  . Drug use: No    Review of Systems  Constitutional: Positive fever/chills Eyes: No visual changes. ENT: No sore throat. Respiratory: Denies cough Esther intestinal: Positive for right lower quadrant pain Genitourinary: Negative for dysuria. Musculoskeletal: Negative for back pain. Skin: Negative for rash.    ____________________________________________   PHYSICAL EXAM:  VITAL SIGNS: ED Triage Vitals  Enc Vitals Group     BP 06/26/18 1707 (!) 147/85     Pulse Rate 06/26/18 1707 (!) 112     Resp 06/26/18 1707 20     Temp 06/26/18 1707 (!) 100.9 F (38.3 C)     Temp Source 06/26/18 1707 Oral     SpO2 06/26/18 1707 99 %     Weight 06/26/18 1705 260 lb (117.9 kg)     Height 06/26/18 1705 5\' 6"  (1.676 m)     Head Circumference --      Peak Flow --      Pain Score 06/26/18 1705 0     Pain Loc --      Pain Edu? --      Excl. in GC? --     Constitutional: Alert and oriented.  No acute distress  eyes: Conjunctivae are normal.  Head: Atraumatic. Nose: No congestion/rhinnorhea. Mouth/Throat: Mucous membranes are moist.   Neck:  supple no lymphadenopathy noted Cardiovascular: Normal rate, regular rhythm. Heart sounds are normal Respiratory: Normal respiratory effort.  No retractions, lungs c t a  Abd: soft tender bs normal all 4 quad, during the abdominal exam patient begins to vomit a yellow-green material GU: deferred Musculoskeletal: FROM all extremities, warm and well perfused Neurologic:  Normal speech and language.  Skin:  Skin is warm, dry and intact. No rash noted. Psychiatric: Mood and affect are normal. Speech and behavior are normal.  ____________________________________________   LABS (all labs ordered are listed, but only abnormal results are displayed)  Labs Reviewed  CBC WITH DIFFERENTIAL/PLATELET  COMPREHENSIVE METABOLIC PANEL  BASIC METABOLIC PANEL  MAGNESIUM   CBC WITH DIFFERENTIAL/PLATELET   ____________________________________________   ____________________________________________  RADIOLOGY  CT abdomen/pelvis with IV contrast showed acute appendicitis with questionable rupture  ____________________________________________   PROCEDURES  Procedure(s) performed: Saline lock, Zofran 4 mg IV   Procedures    ____________________________________________   INITIAL IMPRESSION / ASSESSMENT AND PLAN / ED COURSE  Pertinent labs & imaging results that were available during my care of the patient were reviewed by me and considered in my medical decision making (see chart for details).   Patient is 48 year old male presents emergency department with abdominal pain and fever.  Patient's PCP called and a CT the abdomen pelvis which is shown acute appendicitis.  Physical exam patient  is actively vomiting in the exam room.  Exam is difficult but the abdomen is tender to palpation.  Labs ordered, CT result discussed with Dr. Scotty Court who has discussed this with Dr. Aleen Campi.  Dr. Aleen Campi will be admitting the patient to his service.  He is placed orders.  Patient is in stable condition at this time.     As part of my medical decision making, I reviewed the following data within the electronic MEDICAL RECORD NUMBER Nursing notes reviewed and incorporated, Labs reviewed , Old chart reviewed, Radiograph reviewed CT of the abdomen shows acute appendicitis, A consult was requested and obtained from this/these consultant(s) Surgery, Evaluated by EM attending Dr. Scotty Court, Notes from prior ED visits and [place] Controlled Substance Database  ____________________________________________   FINAL CLINICAL IMPRESSION(S) / ED DIAGNOSES  Final diagnoses:  Acute appendicitis, unspecified acute appendicitis type      NEW MEDICATIONS STARTED DURING THIS VISIT:  New Prescriptions   No medications on file     Note:  This document was prepared using Dragon  voice recognition software and may include unintentional dictation errors.    Faythe Ghee, PA-C 06/26/18 1747    Sharman Cheek, MD 06/26/18 [phone]

## 2018-06-26 NOTE — ED Notes (Addendum)
ED TO INPATIENT HANDOFF REPORT  ED Nurse Name and Phone #:  Helmut Musterlicia [phone]  S Name/Age/Gender [name] CroakWilliam O Economos Jr. 48 y.o. male Room/Bed: ED13A/ED13A  Code Status   Code Status: Full Code  Home/SNF/Other Home Patient oriented to: self, place, time and situation Is this baseline? Yes   Triage Complete: Triage complete  Chief Complaint Abd Pain  Triage Note Here from CT scan.  Pt has acute appendicitis with possible rupture. Febrile in triage.   Allergies Allergies  Allergen Reactions  . Penicillins Anaphylaxis    Happened as a child. Has patient had a PCN reaction causing immediate rash, facial/tongue/throat swelling, SOB or lightheadedness with hypotension: Yes Has patient had a PCN reaction causing severe rash involving mucus membranes or skin necrosis: No Has patient had a PCN reaction that required hospitalization: Unknown Has patient had a PCN reaction occurring within the last 10 years: No If all of the above answers are "NO", then may proceed with Cephalosporin use.  . Sulfa Antibiotics Anaphylaxis    Other reaction(s): Unknown  . Sulfasalazine Anaphylaxis  . Azithromycin Nausea And Vomiting and Rash    Level of Care/Admitting Diagnosis ED Disposition    ED Disposition Condition Comment   Admit  [facility] [[id number]]  Level of Care: Med-Surg [16]  Covid Evaluation: N/A  Diagnosis: Acute appendicitis [[id number]][[id number]]  Admitting Physician: Henrene DodgePISCOYA, JOSE [[id number]]  Attending Physician: Henrene DodgePISCOYA, JOSE [[id number]]  Estimated length of stay: 3 - 4 days  Certification:: I certify this patient will need inpatient services for at least 2 midnights  PT Class (Do Not Modify): Inpatient [101]  PT Acc Code (Do Not Modify): Private [1]       B Medical/Surgery History Past Medical History:  Diagnosis Date  . Acne vulgaris   . Anxiety   . Depression   . Diabetes mellitus without complication (HCC)   . GERD (gastroesophageal reflux  disease)   . Hyperlipidemia   . Hypertension   . OSA (obstructive sleep apnea)    Past Surgical History:  Procedure Laterality Date  . Gum Graft       A IV Location/Drains/Wounds Patient Lines/Drains/Airways Status   Active Line/Drains/Airways    Name:   Placement date:   Placement time:   Site:   Days:   Peripheral IV 06/26/18 Right Antecubital   06/26/18    1612    Antecubital   less than 1          Intake/Output Last 24 hours No intake or output data in the 24 hours ending 06/26/18 1745  Labs/Imaging Results for orders placed or performed during the hospital encounter of 06/26/18 (from the past 48 hour(s))  I-STAT creatinine     Status: None   Collection Time: 06/26/18  4:18 PM  Result Value Ref Range   Creatinine, Ser 1.20 0.61 - 1.24 mg/dL   Ct Abdomen Pelvis W Contrast  Result Date: 06/26/2018 CLINICAL DATA:  Periumbilical right lower quadrant pain with nausea and fever for the past 2 weeks. EXAM: CT ABDOMEN AND PELVIS WITH CONTRAST TECHNIQUE: Multidetector CT imaging of the abdomen and pelvis was performed using the standard protocol following bolus administration of intravenous contrast. CONTRAST:  125mL OMNIPAQUE IOHEXOL 300 MG/ML  SOLN COMPARISON:  None. FINDINGS: Lower chest: No acute abnormality. Hepatobiliary: Diffuse hepatic steatosis. No focal liver abnormality. The gallbladder is unremarkable. No biliary dilatation. Pancreas: Unremarkable. No pancreatic ductal dilatation or surrounding inflammatory changes. Spleen: Normal in size without focal abnormality. Adrenals/Urinary Tract: The adrenal  glands are unremarkable. Subcentimeter low-density lesion in the right kidney is too small to characterize. No renal calculi or hydronephrosis. The bladder is unremarkable for the degree of distention. Stomach/Bowel: The proximal appendix is dilated up to 15 mm. The mid to distal appendix is not well visualized and there is prominent periappendiceal inflammatory changes,  consistent with acute appendicitis. Appendix: Location: Right lower quadrant Diameter: 15 mm Appendicolith: None Mucosal hyper-enhancement: None Extraluminal gas: None Periappendiceal collection: Phlegmonous change without discrete fluid collection The stomach and small bowel are unremarkable. No obstruction. Reactive focal right-sided wall thickening of the sigmoid colon adjacent to the periappendiceal inflammatory changes. Vascular/Lymphatic: No significant vascular findings are present. Prominent subcentimeter reactive right lower quadrant mesenteric lymph nodes. Reproductive: Prostate is unremarkable. Other: No free fluid or pneumoperitoneum. Musculoskeletal: No acute or significant osseous findings. IMPRESSION: 1. Findings consistent with acute appendicitis. Indistinct mid to distal appendix could reflect rupture, although there is no extraluminal air. Prominent periappendiceal phlegmonous change without discrete abscess. 2. Hepatic steatosis. These results will be called to the ordering clinician or representative by the Radiologist Assistant, and communication documented in the PACS or zVision Dashboard. Electronically Signed   By: Obie Dredge M.D.   On: 06/26/2018 16:38    Pending Labs Unresulted Labs (From admission, onward)    Start     Ordered   06/27/18 0500  Basic metabolic panel  Tomorrow morning,   STAT     06/26/18 1733   06/27/18 0500  Magnesium  Tomorrow morning,   STAT     06/26/18 1733   06/27/18 0500  CBC WITH DIFFERENTIAL  Tomorrow morning,   STAT     06/26/18 1733   06/26/18 1715  CBC with Differential  Once,   STAT     06/26/18 1714   06/26/18 1715  Comprehensive metabolic panel  Once,   STAT     06/26/18 1714          Vitals/Pain Today's Vitals   06/26/18 1732 06/26/18 1734 06/26/18 1735 06/26/18 1736  BP: 134/82     Pulse: 100 (!) 103 100 98  Resp: 16     Temp:      TempSrc:      SpO2: 97% 97% 98% 98%  Weight:      Height:      PainSc:        Isolation  Precautions No active isolations  Medications Medications  ciprofloxacin (CIPRO) IVPB 400 mg (400 mg Intravenous New Bag/Given 06/26/18 1741)  metroNIDAZOLE (FLAGYL) IVPB 500 mg (has no administration in time range)  lactated ringers infusion (has no administration in time range)  acetaminophen (TYLENOL) tablet 1,000 mg (has no administration in time range)  oxyCODONE (Oxy IR/ROXICODONE) immediate release tablet 5 mg (has no administration in time range)  HYDROmorphone (DILAUDID) injection 0.5 mg (has no administration in time range)  polyethylene glycol (MIRALAX / GLYCOLAX) packet 17 g (has no administration in time range)  ondansetron (ZOFRAN-ODT) disintegrating tablet 4 mg (has no administration in time range)    Or  ondansetron (ZOFRAN) injection 4 mg (has no administration in time range)  pantoprazole (PROTONIX) injection 40 mg (has no administration in time range)  enoxaparin (LOVENOX) injection 40 mg (has no administration in time range)  ciprofloxacin (CIPRO) IVPB 400 mg (has no administration in time range)    And  metroNIDAZOLE (FLAGYL) IVPB 500 mg (has no administration in time range)  ketorolac (TORADOL) 30 MG/ML injection 30 mg (has no administration in time range)  albuterol (  VENTOLIN HFA) 108 (90 Base) MCG/ACT inhaler 2 puff (has no administration in time range)  Brexpiprazole TABS 2 mg (has no administration in time range)  escitalopram (LEXAPRO) tablet 10 mg (has no administration in time range)  losartan (COZAAR) tablet 50 mg (has no administration in time range)  metoprolol succinate (TOPROL-XL) 24 hr tablet 50 mg (has no administration in time range)  NIFEdipine (PROCARDIA-XL/NIFEDICAL-XL) 24 hr tablet 30 mg (has no administration in time range)  insulin aspart (novoLOG) injection 0-20 Units (has no administration in time range)  ondansetron (ZOFRAN) injection 4 mg (4 mg Intravenous Given 06/26/18 1734)    Mobility walks Low fall risk   Focused  Assessments Cardiac Assessment Handoff:  Cardiac Rhythm: Sinus tachycardia Lab Results  Component Value Date   TROPONINI <0.03 08/04/2017   No results found for: DDIMER Does the Patient currently have chest pain? No  , Pulmonary Assessment Handoff:  Lung sounds:  clear bil O2 Device: Room Air    Abdomen obese, distended, nontender. No pain at this time.       R Recommendations: See Admitting Provider Note  Report given to: Kirt Boys (2C RN)  Additional Notes:

## 2018-06-26 NOTE — ED Triage Notes (Signed)
Here from CT scan.  Pt has acute appendicitis with possible rupture. Febrile in triage.

## 2018-06-26 NOTE — H&P (Signed)
Date of Admission:  06/26/2018  Reason for Admission:  Acute appendicitis  History of Present Illness: [name] [name]. is a 48 y.o. male presents for evaluation of abdominal pain.  He reports he started having abdominal pain in the low mid abdomen and right lower quadrant for about two weeks.  The first week was the worst with worse pain compared to this past week.  This has been associated with nausea, vomiting.  He is able to drink Ensure to stay hydrated and nourished, but he is not particularly hungry at this point.  He also reports having fevers and had a fever of 101+ last night at home.  He went to see his PCP today and had a temperature of 102.4.  He was sent for labs, CT scan and started on Levaquin.  His WBC is 15.4.  CT scan was done showing acute appendicitis with a moderate sized phlegmon around the appendix.  He was told to come to ED for more evaluation and admission.    He denies having any chest pain, and reports his shortness of breath with exertion is at baseline.  He denies other areas of abdominal pain and reports that today his pain is better compared to the first week.  Reports intermittent constipation and diarrhea, but not related to this acute process.    Past Medical History: Past Medical History:  Diagnosis Date  . Acne vulgaris   . Anxiety   . Depression   . Diabetes mellitus without complication (HCC)   . GERD (gastroesophageal reflux disease)   . Hyperlipidemia   . Hypertension   . OSA (obstructive sleep apnea) -- uses CPAP      Past Surgical History: Past Surgical History:  Procedure Laterality Date  . Gum Graft      Home Medications: Prior to Admission medications   Medication Sig Start Date End Date Taking? Authorizing Provider  atorvastatin (LIPITOR) 80 MG tablet Take 80 mg by mouth at bedtime.    Yes [provider]  Brexpiprazole (REXULTI) 2 MG TABS Take 2 mg by mouth daily.    Yes [provider]  calcium-vitamin D (OSCAL  500/200 D-3) 500-200 MG-UNIT tablet Take 1 tablet by mouth 2 (two) times daily. 08/07/17 08/07/18 Yes Mody, Patricia Pesa, MD  cetirizine (ZYRTEC) 10 MG tablet Take 10 mg by mouth at bedtime.    Yes [provider]  escitalopram (LEXAPRO) 10 MG tablet Take 10 mg by mouth daily.   Yes [provider]  glimepiride (AMARYL) 4 MG tablet Take 4 mg by mouth daily with breakfast.   Yes [provider]  losartan (COZAAR) 50 MG tablet Take 50 mg by mouth at bedtime.    Yes [provider]  magnesium oxide (MAG-OX) 400 (241.3 Mg) MG tablet Take 1 tablet (400 mg total) by mouth daily. 08/08/17  Yes Mody, Patricia Pesa, MD  metoprolol succinate (TOPROL-XL) 50 MG 24 hr tablet Take 50 mg by mouth daily. Take with or immediately following a meal.   Yes [provider]  mupirocin ointment (BACTROBAN) 2 % Apply 1 application topically 2 (two) times daily. 05/01/18  Yes [provider]  NIFEdipine (PROCARDIA-XL/ADALAT CC) 30 MG 24 hr tablet Take 30 mg by mouth at bedtime.    Yes [provider]  omeprazole (PRILOSEC) 40 MG capsule Take 40 mg by mouth daily.   Yes [provider]  sitaGLIPtin-metformin (JANUMET) 50-500 MG tablet Take 1 tablet by mouth 2 (two) times daily with a meal.  Yes [provider]  tadalafil (ADCIRCA/CIALIS) 20 MG tablet Take 20 mg by mouth as needed. 09/29/17  Yes [provider]  Vitamin D, Ergocalciferol, (DRISDOL) 50000 units CAPS capsule Take 1 capsule (50,000 Units total) by mouth every 7 (seven) days. 08/12/17  Yes Mody, [name], MD  albuterol (PROVENTIL HFA;VENTOLIN HFA) 108 (90 Base) MCG/ACT inhaler Inhale 2 puffs into the lungs every 6 (six) hours as needed for wheezing or shortness of breath.    [provider]  benzonatate (TESSALON) 100 MG capsule Take 1 capsule (100 mg total) by mouth 3 (three) times daily. Patient not taking: Reported on 06/26/2018 08/07/17   [name], [name], MD  feeding supplement, ENSURE ENLIVE,  (ENSURE ENLIVE) LIQD Take 237 mLs by mouth 3 (three) times daily between meals. 08/07/17   [name], [name], MD  ondansetron (ZOFRAN) 4 MG tablet Take 4 mg by mouth every 8 (eight) hours as needed for nausea. 07/29/17   [provider]  potassium chloride 20 MEQ TBCR Take 40 mEq by mouth daily for 4 days. 08/07/17 08/11/17  [name], [name], MD    Allergies: Allergies  Allergen Reactions  . Penicillins Anaphylaxis    Happened as a child. Has patient had a PCN reaction causing immediate rash, facial/tongue/throat swelling, SOB or lightheadedness with hypotension: Yes Has patient had a PCN reaction causing severe rash involving mucus membranes or skin necrosis: No Has patient had a PCN reaction that required hospitalization: Unknown Has patient had a PCN reaction occurring within the last 10 years: No If all of the above answers are "NO", then may proceed with Cephalosporin use.  . Sulfa Antibiotics Anaphylaxis    Other reaction(s): Unknown  . Sulfasalazine Anaphylaxis  . Azithromycin Nausea And Vomiting and Rash    Social History:  reports that he has never smoked. He has never used smokeless tobacco. He reports that he does not drink alcohol or use drugs.   Family History: Family History  Problem Relation Age of Onset  . Colon cancer Mother   . CAD Father   . Diabetes Sister     Review of Systems: Review of Systems  Constitutional: Positive for fever. Negative for chills.  HENT: Negative for hearing loss.   Eyes: Negative for blurred vision.  Respiratory: Positive for shortness of breath (at baseline).   Cardiovascular: Negative for chest pain.  Gastrointestinal: Positive for abdominal pain, nausea and vomiting. Negative for constipation and diarrhea.  Genitourinary: Negative for dysuria.  Musculoskeletal: Negative for myalgias.  Skin: Negative for rash.  Neurological: Negative for dizziness.  Psychiatric/Behavioral: Negative for depression.    Physical Exam BP 134/82    Pulse 97   Temp (!) 100.9 F (38.3 C) (Oral)   Resp 16   Ht 5\' 6"  (1.676 m)   Wt 117.9 kg   SpO2 95%   BMI 41.97 kg/m  CONSTITUTIONAL: No acute distress HEENT:  Normocephalic, atraumatic, extraocular motion intact. NECK: Trachea is midline, and there is no jugular venous distension.  RESPIRATORY:  Lungs are clear, and breath sounds are equal bilaterally. Normal respiratory effort without pathologic use of accessory muscles. CARDIOVASCULAR: Heart is regular without murmurs, gallops, or rubs. GI: The abdomen is soft, obese, non-distended, with some tenderness to palpation in RLQ, but non-peritoneal. There were no palpable masses.  MUSCULOSKELETAL:  Normal muscle strength and tone in all four extremities.  No peripheral edema or cyanosis. SKIN: Skin turgor is normal. There are no pathologic skin lesions.  NEUROLOGIC:  Motor and sensation is grossly normal.  Cranial  nerves are grossly intact. PSYCH:  Alert and oriented to person, place and time. Affect is normal.  Laboratory Analysis: Results for orders placed or performed during the hospital encounter of 06/26/18 (from the past 24 hour(s))  CBC with Differential     Status: Abnormal   Collection Time: 06/26/18  5:14 PM  Result Value Ref Range   WBC 15.4 (H) 4.0 - 10.5 K/uL   RBC 4.09 (L) 4.22 - 5.81 MIL/uL   Hemoglobin 11.0 (L) 13.0 - 17.0 g/dL   HCT 16.1 (L) 09.6 - 04.5 %   MCV 84.4 80.0 - 100.0 fL   MCH 26.9 26.0 - 34.0 pg   MCHC 31.9 30.0 - 36.0 g/dL   RDW 40.9 81.1 - 91.4 %   Platelets 273 150 - 400 K/uL   nRBC 0.0 0.0 - 0.2 %   Neutrophils Relative % 80 %   Neutro Abs 12.3 (H) 1.7 - 7.7 K/uL   Lymphocytes Relative 9 %   Lymphs Abs 1.4 0.7 - 4.0 K/uL   Monocytes Relative 6 %   Monocytes Absolute 0.9 0.1 - 1.0 K/uL   Eosinophils Relative 2 %   Eosinophils Absolute 0.3 0.0 - 0.5 K/uL   Basophils Relative 1 %   Basophils Absolute 0.1 0.0 - 0.1 K/uL   Immature Granulocytes 2 %   Abs Immature Granulocytes 0.33 (H) 0.00 -  0.07 K/uL  Comprehensive metabolic panel     Status: Abnormal   Collection Time: 06/26/18  5:14 PM  Result Value Ref Range   Sodium 136 135 - 145 mmol/L   Potassium 3.9 3.5 - 5.1 mmol/L   Chloride 95 (L) 98 - 111 mmol/L   CO2 27 22 - 32 mmol/L   Glucose, Bld 189 (H) 70 - 99 mg/dL   BUN 12 6 - 20 mg/dL   Creatinine, Ser 7.82 0.61 - 1.24 mg/dL   Calcium 8.2 (L) 8.9 - 10.3 mg/dL   Total Protein 8.1 6.5 - 8.1 g/dL   Albumin 3.2 (L) 3.5 - 5.0 g/dL   AST 21 15 - 41 U/L   ALT 19 0 - 44 U/L   Alkaline Phosphatase 103 38 - 126 U/L   Total Bilirubin 0.4 0.3 - 1.2 mg/dL   GFR calc non Af Amer >60 >60 mL/min   GFR calc Af Amer >60 >60 mL/min   Anion gap 14 5 - 15    Imaging: Ct Abdomen Pelvis W Contrast  Result Date: 06/26/2018 CLINICAL DATA:  Periumbilical right lower quadrant pain with nausea and fever for the past 2 weeks. EXAM: CT ABDOMEN AND PELVIS WITH CONTRAST TECHNIQUE: Multidetector CT imaging of the abdomen and pelvis was performed using the standard protocol following bolus administration of intravenous contrast. CONTRAST:  OMNIPAQUE IOHEXOL 300 MG/ML  SOLN COMPARISON:  None. FINDINGS: Lower chest: No acute abnormality. Hepatobiliary: Diffuse hepatic steatosis. No focal liver abnormality. The gallbladder is unremarkable. No biliary dilatation. Pancreas: Unremarkable. No pancreatic ductal dilatation or surrounding inflammatory changes. Spleen: Normal in size without focal abnormality. Adrenals/Urinary Tract: The adrenal glands are unremarkable. Subcentimeter low-density lesion in the right kidney is too small to characterize. No renal calculi or hydronephrosis. The bladder is unremarkable for the degree of distention. Stomach/Bowel: The proximal appendix is dilated up to 15 mm. The mid to distal appendix is not well visualized and there is prominent periappendiceal inflammatory changes, consistent with acute appendicitis. Appendix: Location: Right lower quadrant Diameter: 15 mm  Appendicolith: None Mucosal hyper-enhancement: None Extraluminal gas: None Periappendiceal collection:  Phlegmonous change without discrete fluid collection The stomach and small bowel are unremarkable. No obstruction. Reactive focal right-sided wall thickening of the sigmoid colon adjacent to the periappendiceal inflammatory changes. Vascular/Lymphatic: No significant vascular findings are present. Prominent subcentimeter reactive right lower quadrant mesenteric lymph nodes. Reproductive: Prostate is unremarkable. Other: No free fluid or pneumoperitoneum. Musculoskeletal: No acute or significant osseous findings. IMPRESSION: 1. Findings consistent with acute appendicitis. Indistinct mid to distal appendix could reflect rupture, although there is no extraluminal air. Prominent periappendiceal phlegmonous change without discrete abscess. 2. Hepatic steatosis. These results will be called to the ordering clinician or representative by the Radiologist Assistant, and communication documented in the PACS or zVision Dashboard. Electronically Signed   By: Obie Dredge M.D.   On: 06/26/2018 16:38    Assessment and Plan: This is a 48 y.o. male with acute appendicitis with prominent phlegmon.  I have independently viewed the patient's imaging study and agree with the findings above.  Discussed with the patient that he has acute appendicitis but given the degree of inflammation and phlegmon changes, I would like to attempt conservative management first.  We will admit the patient to surgical service, keep him NPO, start IV fluid hydration, IV antibiotics, and appropriate pain and nausea control.  Discussed with him that he may simply get better which would include resolution of fevers, improved pain, and normalization of WBC.  He may not improve and may still need to go to surgery, or he may improve but develop an abscess which would require drainage.  Discussed length of stay, expectations.  Depending on his symptoms,  may start clears as early as tomorrow but all depends on his improvement.  Have also called his wife Augusto Gamble and updated her about our plan of action.  She will bring his home CPAP for use tonight.     Howie Ill, MD [facility] Pg:  [phone]

## 2018-06-26 NOTE — Progress Notes (Signed)
PHARMACIST - PHYSICIAN COMMUNICATION  CONCERNING:  Enoxaparin (Lovenox) for DVT Prophylaxis    RECOMMENDATION: Patient was prescribed enoxaprin 40mg  q24 hours for VTE prophylaxis.   Filed Weights   06/26/18 1705  Weight: 260 lb (117.9 kg)    Body mass index is 41.97 kg/m.  Estimated Creatinine Clearance: 90.2 mL/min (by C-G formula based on SCr of 1.21 mg/dL).   Based on [facility] policy patient is candidate for enoxaparin 40mg  every 12 hour dosing due to BMI being >40.   DESCRIPTION: Pharmacy has adjusted enoxaparin dose per [facility] policy.  Patient is now receiving enoxaparin 40mg  every 12 hours.   Katha Cabal, PharmD Clinical Pharmacist  06/26/2018 6:00 PM

## 2018-06-26 NOTE — ED Notes (Signed)
Dr. Piscoya at bedside  

## 2018-06-27 DIAGNOSIS — K3580 Unspecified acute appendicitis: Secondary | ICD-10-CM

## 2018-06-27 LAB — CBC WITH DIFFERENTIAL/PLATELET
Abs Immature Granulocytes: 0.11 10*3/uL — ABNORMAL HIGH (ref 0.00–0.07)
Basophils Absolute: 0.1 10*3/uL (ref 0.0–0.1)
Basophils Relative: 0 %
Eosinophils Absolute: 0.3 10*3/uL (ref 0.0–0.5)
Eosinophils Relative: 2 %
HCT: 32.1 % — ABNORMAL LOW (ref 39.0–52.0)
Hemoglobin: 10.2 g/dL — ABNORMAL LOW (ref 13.0–17.0)
Immature Granulocytes: 1 %
Lymphocytes Relative: 15 %
Lymphs Abs: 2 10*3/uL (ref 0.7–4.0)
MCH: 26.9 pg (ref 26.0–34.0)
MCHC: 31.8 g/dL (ref 30.0–36.0)
MCV: 84.7 fL (ref 80.0–100.0)
Monocytes Absolute: 0.9 10*3/uL (ref 0.1–1.0)
Monocytes Relative: 7 %
Neutro Abs: 10.3 10*3/uL — ABNORMAL HIGH (ref 1.7–7.7)
Neutrophils Relative %: 75 %
Platelets: 222 10*3/uL (ref 150–400)
RBC: 3.79 MIL/uL — ABNORMAL LOW (ref 4.22–5.81)
RDW: 14.5 % (ref 11.5–15.5)
WBC: 13.7 10*3/uL — ABNORMAL HIGH (ref 4.0–10.5)
nRBC: 0 % (ref 0.0–0.2)

## 2018-06-27 LAB — GLUCOSE, CAPILLARY
Glucose-Capillary: 118 mg/dL — ABNORMAL HIGH (ref 70–99)
Glucose-Capillary: 138 mg/dL — ABNORMAL HIGH (ref 70–99)
Glucose-Capillary: 183 mg/dL — ABNORMAL HIGH (ref 70–99)
Glucose-Capillary: 183 mg/dL — ABNORMAL HIGH (ref 70–99)

## 2018-06-27 LAB — BASIC METABOLIC PANEL
Anion gap: 11 (ref 5–15)
BUN: 11 mg/dL (ref 6–20)
CO2: 28 mmol/L (ref 22–32)
Calcium: 7.9 mg/dL — ABNORMAL LOW (ref 8.9–10.3)
Chloride: 101 mmol/L (ref 98–111)
Creatinine, Ser: 1.21 mg/dL (ref 0.61–1.24)
GFR calc Af Amer: >60 mL/min (ref >60)
GFR calc non Af Amer: >60 mL/min (ref >60)
Glucose, Bld: 150 mg/dL — ABNORMAL HIGH (ref 70–99)
Potassium: 3.6 mmol/L (ref 3.5–5.1)
Sodium: 140 mmol/L (ref 135–145)

## 2018-06-27 LAB — MAGNESIUM: Magnesium: 1.9 mg/dL (ref 1.7–2.4)

## 2018-06-27 MED ORDER — ENSURE ENLIVE PO LIQD
237.0000 mL | Freq: Two times a day (BID) | ORAL | Status: DC
  Administered 2018-06-27 – 2018-07-01 (×7): 237 mL via ORAL

## 2018-06-27 NOTE — Progress Notes (Addendum)
[facility] SURGICAL PROGRESS NOTE (cpt [phone])  Hospital Day(s): 1.   Post op day(s):  [name] [name].   Interval History: Patient seen and examined, no acute events or new complaints overnight. Patient reports that he is feeling better this morning. He reports resolution in his RLQ and is without fever, chills, nausea, or emesis. He has remained NPO. Not mobilized since admission. Leukocytosis is improving. No additional complaints.   Review of Systems:  Constitutional: denies fever, chills  Respiratory: denies any shortness of breath  Cardiovascular: denies chest pain or palpitations  Gastrointestinal: denies abdominal pain, N/V, or diarrhea/and bowel function as per interval history Genitourinary: denies burning with urination or urinary frequency Neurological: denies HA or vision/hearing changes   Vital signs in last 24 hours: [min-max] current  Temp:  [97.5 F (36.4 C)-100.9 F (38.3 C)] 97.5 F (36.4 C) (04/28 0420) Pulse Rate:  [63-112] 63 (04/28 0420) Resp:  [16-20] 20 (04/28 0420) BP: (102-147)/(69-85) 102/69 (04/28 0420) SpO2:  [92 %-99 %] 92 % (04/28 0420) Weight:  [117.9 kg] 117.9 kg (04/27 1705)     Height: 5\' 6"  (167.6 cm) Weight: 117.9 kg BMI (Calculated): 41.99   Intake/Output this shift:  No intake/output data recorded.    Physical Exam:  Constitutional: alert, cooperative and no distress  HENT: normocephalic without obvious abnormality  Eyes: PERRL, EOM's grossly intact and symmetric  Respiratory: breathing non-labored at rest, wearing CPAP  Cardiovascular: regular rate and sinus rhythm  Gastrointestinal: Obese, soft, non-tender, and non-distended, no rebound/guarding, no peritonitis Musculoskeletal: no edema or wounds, motor and sensation grossly intact, NT    Labs:  CBC Latest Ref Rng & Units 06/27/2018 06/26/2018 08/05/2017  WBC 4.0 - 10.5 K/uL 13.7(H) 15.4(H) 9.9  Hemoglobin 13.0 - 17.0 g/dL 10.2(L) 11.0(L) 11.2(L)  Hematocrit 39.0 - 52.0 % 32.1(L)  34.5(L) 32.9(L)  Platelets 150 - 400 K/uL 222 273 220   CMP Latest Ref Rng & Units 06/27/2018 06/26/2018 06/26/2018  Glucose 70 - 99 mg/dL 604(V150(H) 409(W189(H) -  BUN 6 - 20 mg/dL 11 12 -  Creatinine 1.190.61 - 1.24 mg/dL 1.471.21 8.291.21 5.621.20  Sodium 135 - 145 mmol/L 140 136 -  Potassium 3.5 - 5.1 mmol/L 3.6 3.9 -  Chloride 98 - 111 mmol/L 101 95(L) -  CO2 22 - 32 mmol/L 28 27 -  Calcium 8.9 - 10.3 mg/dL 7.9(L) 8.2(L) -  Total Protein 6.5 - 8.1 g/dL - 8.1 -  Total Bilirubin 0.3 - 1.2 mg/dL - 0.4 -  Alkaline Phos 38 - 126 U/L - 103 -  AST 15 - 41 U/L - 21 -  ALT 0 - 44 U/L - 19 -     Imaging studies: No new pertinent imaging studies   Assessment/Plan: (ICD-10's: K35.80) 48 y.o. male with improving leukocytosis and resolution of abdominal pain secondary to acute appendicitis and prominent phlegmon   - Start on CLD this morning + IVF   - Continue IV ABx (Cipro/Flagyl)  - pain control prn/antiemetics prn  - Monitor abdominal examination/on-going bowel function  - monitor leukocytosis; morning CBC  - Holding off on surgical management for now given degree of inflammation on CT scan, however, he understands if he were not to improve/clinically deteriorate he would require intervention sooner.    - Will require interval appendectomy if conservative management successful.    - Medical management of comorbidities  - DVT prophylaxis   All of the above findings and recommendations were discussed with the patient, and the medical team, and all of  patient's questions were answered to his expressed satisfaction.   -- Lynden Oxford, PA-C [facility] 06/27/2018, 7:26 AM [phone] M-F: 7am - 4pm  I saw and evaluated the patient.  I agree with the above documentation, exam, and plan, which I have edited where appropriate. Duanne Guess  10:23 AM

## 2018-06-28 LAB — CBC
HCT: 33.6 % — ABNORMAL LOW (ref 39.0–52.0)
Hemoglobin: 10.6 g/dL — ABNORMAL LOW (ref 13.0–17.0)
MCH: 26.8 pg (ref 26.0–34.0)
MCHC: 31.5 g/dL (ref 30.0–36.0)
MCV: 84.8 fL (ref 80.0–100.0)
Platelets: 198 10*3/uL (ref 150–400)
RBC: 3.96 MIL/uL — ABNORMAL LOW (ref 4.22–5.81)
RDW: 14.5 % (ref 11.5–15.5)
WBC: 9.6 10*3/uL (ref 4.0–10.5)
nRBC: 0 % (ref 0.0–0.2)

## 2018-06-28 LAB — GLUCOSE, CAPILLARY
Glucose-Capillary: 108 mg/dL — ABNORMAL HIGH (ref 70–99)
Glucose-Capillary: 153 mg/dL — ABNORMAL HIGH (ref 70–99)
Glucose-Capillary: 122 mg/dL — ABNORMAL HIGH (ref 70–99)
Glucose-Capillary: 127 mg/dL — ABNORMAL HIGH (ref 70–99)
Glucose-Capillary: 201 mg/dL — ABNORMAL HIGH (ref 70–99)
Glucose-Capillary: 143 mg/dL — ABNORMAL HIGH (ref 70–99)

## 2018-06-28 LAB — BASIC METABOLIC PANEL
Anion gap: 9 (ref 5–15)
BUN: 12 mg/dL (ref 6–20)
CO2: 29 mmol/L (ref 22–32)
Calcium: 8 mg/dL — ABNORMAL LOW (ref 8.9–10.3)
Chloride: 102 mmol/L (ref 98–111)
Creatinine, Ser: 1.08 mg/dL (ref 0.61–1.24)
GFR calc Af Amer: >60 mL/min (ref >60)
GFR calc non Af Amer: >60 mL/min (ref >60)
Glucose, Bld: 126 mg/dL — ABNORMAL HIGH (ref 70–99)
Potassium: 3.8 mmol/L (ref 3.5–5.1)
Sodium: 140 mmol/L (ref 135–145)

## 2018-06-28 MED ORDER — INSULIN ASPART 100 UNIT/ML ~~LOC~~ SOLN
3.0000 [IU] | Freq: Once | SUBCUTANEOUS | Status: AC
  Administered 2018-06-28: 3 [IU] via SUBCUTANEOUS
  Filled 2018-06-28: qty 1

## 2018-06-28 MED ORDER — IBUPROFEN 400 MG PO TABS
400.0000 mg | ORAL_TABLET | ORAL | Status: DC | PRN
  Administered 2018-06-28: 21:00:00 400 mg via ORAL
  Filled 2018-06-28: qty 1

## 2018-06-28 NOTE — Progress Notes (Addendum)
[facility] SURGICAL PROGRESS NOTE (cpt [phone])  Hospital Day(s): 2.   Post op day(s):  Marland Kitchen.   Interval History: Patient seen and examined, no acute events or new complaints overnight. Patient reports that he is feeling good this morning. He did report nausea overnight but this resolved. No complaints of fever, chills, nausea, or emesis this morning. He has tolerated a clear liquids diet without issue. + flatus. Mobilizing. No other complaints this morning.   Review of Systems:  Constitutional: denies fever, chills  Respiratory: denies any shortness of breath  Cardiovascular: denies chest pain or palpitations  Gastrointestinal: denies abdominal pain, N/V, or diarrhea/and bowel function as per interval history Genitourinary: denies burning with urination or urinary frequency   Vital signs in last 24 hours: [min-max] current  Temp:  [97.6 F (36.4 C)-98.1 F (36.7 C)] 98.1 F (36.7 C) (04/29 0310) Pulse Rate:  [72-79] 79 (04/29 0310) Resp:  [17-19] 19 (04/29 0310) BP: (114-124)/(71-82) 124/78 (04/29 0310) SpO2:  [95 %-98 %] 97 % (04/29 0310)     Height: 5\' 6"  (167.6 cm) Weight: 117.9 kg BMI (Calculated): 41.99   Intake/Output this shift:  No intake/output data recorded.    Physical Exam:  Constitutional: alert, cooperative and no distress  HENT: normocephalic without obvious abnormality  Eyes: PERRL, EOM's grossly intact and symmetric  Respiratory: breathing non-labored at rest Cardiovascular: regular rate and sinus rhythm  Gastrointestinal: Obese, soft, non-tender, and non-distended, no rebound/guarding, no peritonitis Musculoskeletal: no edema or wounds, motor and sensation grossly intact, NT    Labs:  CBC Latest Ref Rng & Units 06/28/2018 06/27/2018 06/26/2018  WBC 4.0 - 10.5 K/uL 9.6 13.7(H) 15.4(H)  Hemoglobin 13.0 - 17.0 g/dL 10.6(L) 10.2(L) 11.0(L)  Hematocrit 39.0 - 52.0 % 33.6(L) 32.1(L) 34.5(L)  Platelets 150 - 400 K/uL 198 222 273   CMP Latest Ref  Rng & Units 06/28/2018 06/27/2018 06/26/2018  Glucose 70 - 99 mg/dL 604(V126(H) 409(W150(H) 119(J189(H)  BUN 6 - 20 mg/dL 12 11 12   Creatinine 0.61 - 1.24 mg/dL 4.781.08 2.951.21 6.211.21  Sodium 135 - 145 mmol/L 140 140 136  Potassium 3.5 - 5.1 mmol/L 3.8 3.6 3.9  Chloride 98 - 111 mmol/L 102 101 95(L)  CO2 22 - 32 mmol/L 29 28 27   Calcium 8.9 - 10.3 mg/dL 8.0(L) 7.9(L) 8.2(L)  Total Protein 6.5 - 8.1 g/dL - - 8.1  Total Bilirubin 0.3 - 1.2 mg/dL - - 0.4  Alkaline Phos 38 - 126 U/L - - 103  AST 15 - 41 U/L - - 21  ALT 0 - 44 U/L - - 19     Imaging studies: No new pertinent imaging studies   Assessment/Plan: (ICD-10's: K35.90) 48 y.o. male with resolved leukocytosis and continued resolution of abdominal pain secondary to acute appendicitis and prominent phlegmon   - Advance to full liquid diet + Discontinue IVF   - Continue IV ABx (Cipro/Flagyl)             - pain control prn/antiemetics prn             - Monitor abdominal examination/on-going bowel function   - Holding off on surgical management for now given degree of inflammation on CT scan, however, he understands if he were not to improve/clinically deteriorate he would require intervention sooner.               - Will require interval appendectomy if conservative management successful.               -  Medical management of comorbidities             - DVT prophylaxis   - Discharge planning: If tolerating advancement of diet without pain/clinical deterioration then hopefully home in 24-48 hours   All of the above findings and recommendations were discussed with the patient, and the medical team, and all of patient's questions were answered to his expressed satisfaction.  -- Lynden Oxford, PA-C [facility] 06/28/2018, 7:22 AM [phone] M-F: 7am - 4pm

## 2018-06-29 ENCOUNTER — Inpatient Hospital Stay: Payer: Medicare Other

## 2018-06-29 ENCOUNTER — Encounter: Payer: Self-pay | Admitting: Radiology

## 2018-06-29 LAB — BASIC METABOLIC PANEL
Anion gap: 11 (ref 5–15)
BUN: 11 mg/dL (ref 6–20)
CO2: 28 mmol/L (ref 22–32)
Calcium: 7.8 mg/dL — ABNORMAL LOW (ref 8.9–10.3)
Chloride: 103 mmol/L (ref 98–111)
Creatinine, Ser: 1.08 mg/dL (ref 0.61–1.24)
GFR calc Af Amer: >60 mL/min (ref >60)
GFR calc non Af Amer: >60 mL/min (ref >60)
Glucose, Bld: 162 mg/dL — ABNORMAL HIGH (ref 70–99)
Potassium: 3.5 mmol/L (ref 3.5–5.1)
Sodium: 142 mmol/L (ref 135–145)

## 2018-06-29 LAB — CBC
HCT: 32.9 % — ABNORMAL LOW (ref 39.0–52.0)
Hemoglobin: 10.3 g/dL — ABNORMAL LOW (ref 13.0–17.0)
MCH: 26.8 pg (ref 26.0–34.0)
MCHC: 31.3 g/dL (ref 30.0–36.0)
MCV: 85.5 fL (ref 80.0–100.0)
Platelets: 210 10*3/uL (ref 150–400)
RBC: 3.85 MIL/uL — ABNORMAL LOW (ref 4.22–5.81)
RDW: 14.8 % (ref 11.5–15.5)
WBC: 8.8 10*3/uL (ref 4.0–10.5)
nRBC: 0 % (ref 0.0–0.2)

## 2018-06-29 LAB — GLUCOSE, CAPILLARY
Glucose-Capillary: 124 mg/dL — ABNORMAL HIGH (ref 70–99)
Glucose-Capillary: 149 mg/dL — ABNORMAL HIGH (ref 70–99)
Glucose-Capillary: 152 mg/dL — ABNORMAL HIGH (ref 70–99)
Glucose-Capillary: 186 mg/dL — ABNORMAL HIGH (ref 70–99)
Glucose-Capillary: 210 mg/dL — ABNORMAL HIGH (ref 70–99)
Glucose-Capillary: 162 mg/dL — ABNORMAL HIGH (ref 70–99)

## 2018-06-29 MED ORDER — IOHEXOL 240 MG/ML SOLN
25.0000 mL | INTRAMUSCULAR | Status: AC
  Administered 2018-06-29: 05:00:00 25 mL via ORAL

## 2018-06-29 MED ORDER — IOHEXOL 300 MG/ML  SOLN
125.0000 mL | Freq: Once | INTRAMUSCULAR | Status: AC | PRN
  Administered 2018-06-29: 125 mL via INTRAVENOUS

## 2018-06-29 NOTE — Progress Notes (Signed)
Pt BS 215 at 20:00 last PM. Per sliding scale Pt to get 7 units insulin. MD called and one time dose of 3 units given instead. PT checked again at 00:00 and 04:00 with BS decreasing to 152 then 124. No insulin admin at those times.  Pt started on a soft diet but did not eat much.  Will check BS at 08:00am. MD please evaluate, Thanks.

## 2018-06-29 NOTE — Progress Notes (Signed)
Per Ian Malkin, S. PA, ok to give Lovenox.   Madie Reno, RN

## 2018-06-29 NOTE — Progress Notes (Signed)
Hold lovenox per [place], S. PA.   Madie Reno, RN

## 2018-06-29 NOTE — Progress Notes (Signed)
Patient requesting solid food. Per Piscoya advance diet to carb modified. Also change q4 CBG to ACHS CBG.   Madie Reno, RN

## 2018-06-29 NOTE — Progress Notes (Signed)
   06/29/18 1100  Clinical Encounter Type  Visited With Patient  Visit Type Initial;Spiritual support  Referral From Chaplain  Consult/Referral To Chaplain  Chaplain was rounding and stop to visit patient was lying in bed on phone. Chaplain introduced herself and told patient she will come back and patient said "yes come back"

## 2018-06-29 NOTE — Progress Notes (Signed)
[facility] SURGICAL PROGRESS NOTE (cpt [phone])  Hospital Day(s): 3.   Post op day(s):  Marland Kitchen   Interval History: Patient seen and examined, he did have a fever of 102 overnight and is endorsing nausea this morning. No reports of emesis or abdominal pain. He had been tolerating full liquids yesterday but his appetite is limited. Mobilizing well. No other complaints this morning.   Review of Systems:  Constitutional: + fever, denies chills  Respiratory: denies any shortness of breath  Cardiovascular: denies chest pain or palpitations  Gastrointestinal: denies abdominal pain, endorses Nausea, denies Vomiting, or diarrhea/and bowel function as per interval history Genitourinary: denies burning with urination or urinary frequency   Vital signs in last 24 hours: [min-max] current  Temp:  [98.4 F (36.9 C)-102.2 F (39 C)] 98.4 F (36.9 C) (04/30 0415) Pulse Rate:  [94-101] 94 (04/30 0415) Resp:  [20] 20 (04/30 0415) BP: (122-158)/(80-89) 122/81 (04/30 0415) SpO2:  [95 %-98 %] 98 % (04/30 0415)     Height: 5\' 6"  (167.6 cm) Weight: 117.9 kg BMI (Calculated): 41.99   Intake/Output this shift:  No intake/output data recorded.    Physical Exam:  Constitutional: alert, cooperative and no distress  HENT: normocephalic without obvious abnormality  Eyes: PERRL, EOM's grossly intact and symmetric  Respiratory: breathing non-labored at rest  Cardiovascular: regular rate and sinus rhythm  Gastrointestinal: soft, non-tender, and non-distended. No rebound/guarding. Negative Rovsing's Musculoskeletal: UE and LE FROM, no edema or wounds, motor and sensation grossly intact, NT    Labs:  CBC Latest Ref Rng & Units 06/29/2018 06/28/2018 06/27/2018  WBC 4.0 - 10.5 K/uL 8.8 9.6 13.7(H)  Hemoglobin 13.0 - 17.0 g/dL 10.3(L) 10.6(L) 10.2(L)  Hematocrit 39.0 - 52.0 % 32.9(L) 33.6(L) 32.1(L)  Platelets 150 - 400 K/uL 210 198 222   CMP Latest Ref Rng & Units 06/29/2018 06/28/2018 06/27/2018   Glucose 70 - 99 mg/dL 786(V) 672(C) 947(S)  BUN 6 - 20 mg/dL 11 12 11   Creatinine 0.61 - 1.24 mg/dL 9.62 8.36 6.29  Sodium 135 - 145 mmol/L 142 140 140  Potassium 3.5 - 5.1 mmol/L 3.5 3.8 3.6  Chloride 98 - 111 mmol/L 103 102 101  CO2 22 - 32 mmol/L 28 29 28   Calcium 8.9 - 10.3 mg/dL 7.8(L) 8.0(L) 7.9(L)  Total Protein 6.5 - 8.1 g/dL - - -  Total Bilirubin 0.3 - 1.2 mg/dL - - -  Alkaline Phos 38 - 126 U/L - - -  AST 15 - 41 U/L - - -  ALT 0 - 44 U/L - - -     Imaging studies:   CT Abdomen/Pelvis (06/29/2018) personally reviewed and discussed with Dr Aleen Campi, reviewed with radiologist on phone, report below reviewed:  IMPRESSION: Continued presence of extensive inflammatory changes in right lower quadrant consistent with appendicitis and associated phlegmon. There is now noted 3.7 x 3.2 cm fluid collection in this area concerning for abscess.   Assessment/Plan: (ICD-10's: K72.80) 48 y.o. male with fever overnight but still without significant pain attributable to acute appendicitis with prominent phlegmon on presentation whose repeat CT scan this morning concerning for possible developing abscess although still no clear definition for drainage.    - Okay to resume full liquids + ensure this morning   - Continue IV Abx (Zosyn)   - pain control prn; antiemetics prn  - Monitor abdominal examination; on-going bowel function  - Will continue to follow over the weekend, if he develops worsening fevers/leukocytosis may consider repeat CT over the  weekend for possible drainage.    - Medical management of comorbidities  - DVT prophylaxis   All of the above findings and recommendations were discussed with the patient, and the medical team, and all of patient's questions were answered to his expressed satisfaction.  -- Lynden OxfordZachary Raijon Lindfors, PA-C [facility] 06/29/2018, 10:18 AM [phone] M-F: 7am - 4pm

## 2018-06-30 LAB — CBC WITH DIFFERENTIAL/PLATELET
Abs Immature Granulocytes: 0.11 10*3/uL — ABNORMAL HIGH (ref 0.00–0.07)
Basophils Absolute: 0 10*3/uL (ref 0.0–0.1)
Basophils Relative: 0 %
Eosinophils Absolute: 0.2 10*3/uL (ref 0.0–0.5)
Eosinophils Relative: 2 %
HCT: 30.7 % — ABNORMAL LOW (ref 39.0–52.0)
Hemoglobin: 9.9 g/dL — ABNORMAL LOW (ref 13.0–17.0)
Immature Granulocytes: 1 %
Lymphocytes Relative: 13 %
Lymphs Abs: 1.3 10*3/uL (ref 0.7–4.0)
MCH: 27.3 pg (ref 26.0–34.0)
MCHC: 32.2 g/dL (ref 30.0–36.0)
MCV: 84.6 fL (ref 80.0–100.0)
Monocytes Absolute: 1 10*3/uL (ref 0.1–1.0)
Monocytes Relative: 10 %
Neutro Abs: 7.4 10*3/uL (ref 1.7–7.7)
Neutrophils Relative %: 74 %
Platelets: 219 10*3/uL (ref 150–400)
RBC: 3.63 MIL/uL — ABNORMAL LOW (ref 4.22–5.81)
RDW: 14.8 % (ref 11.5–15.5)
WBC: 9.9 10*3/uL (ref 4.0–10.5)
nRBC: 0 % (ref 0.0–0.2)

## 2018-06-30 LAB — GLUCOSE, CAPILLARY
Glucose-Capillary: 148 mg/dL — ABNORMAL HIGH (ref 70–99)
Glucose-Capillary: 159 mg/dL — ABNORMAL HIGH (ref 70–99)
Glucose-Capillary: 172 mg/dL — ABNORMAL HIGH (ref 70–99)
Glucose-Capillary: 195 mg/dL — ABNORMAL HIGH (ref 70–99)

## 2018-06-30 NOTE — Care Management Important Message (Signed)
Important Message  Patient Details  Name: [name] [name]. MRN: [id number] Date of Birth: Jul 20, 1970   Medicare Important Message Given:  Yes    Johnell Comings 06/30/2018, 10:54 AM

## 2018-06-30 NOTE — Progress Notes (Signed)
[facility] SURGICAL PROGRESS NOTE (cpt [phone])  Hospital Day(s): 4.   Post op day(s):  Marland Kitchen   Interval History: Patient seen and examined, no acute events or new complaints overnight. Patient reports that he continues to feel pretty good this morning. He denied any fever, chills, nausea or emesis. He continues to tolerate a soft diet without issue. Endorsing bowel function. Mobilizing well. No other complaints.   Review of Systems:  Constitutional: denies fever, chills  Respiratory: denies any shortness of breath  Cardiovascular: denies chest pain or palpitations  Gastrointestinal: denies abdominal pain, N/V, or diarrhea/and bowel function as per interval history Genitourinary: denies burning with urination or urinary frequency   Vital signs in last 24 hours: [min-max] current  Temp:  [97.3 F (36.3 C)-99 F (37.2 C)] 99 F (37.2 C) (05/01 0417) Pulse Rate:  [94-105] 94 (05/01 0417) Resp:  [18-20] 20 (05/01 0417) BP: (142-147)/(85-100) 147/85 (05/01 0417) SpO2:  [91 %-98 %] 91 % (05/01 0417)     Height: 5\' 6"  (167.6 cm) Weight: 117.9 kg BMI (Calculated): 41.99   Intake/Output this shift:  Total I/O In: 240 [P.O.:240] Out: -     Physical Exam:  Constitutional: alert, cooperative and no distress  HENT: normocephalic without obvious abnormality  Eyes: PERRL, EOM's grossly intact and symmetric  Respiratory: breathing non-labored at rest  Cardiovascular: regular rate and sinus rhythm  Gastrointestinal: soft, non-tender, and non-distended. No rebound/guarding. Negative Rovsing's Musculoskeletal: UE and LE FROM, no edema or wounds, motor and sensation grossly intact, NT    Labs:  CBC Latest Ref Rng & Units 06/30/2018 06/29/2018 06/28/2018  WBC 4.0 - 10.5 K/uL 9.9 8.8 9.6  Hemoglobin 13.0 - 17.0 g/dL 0.8(X) 10.3(L) 10.6(L)  Hematocrit 39.0 - 52.0 % 30.7(L) 32.9(L) 33.6(L)  Platelets 150 - 400 K/uL 219 210 198   CMP Latest Ref Rng & Units 06/29/2018 06/28/2018  06/27/2018  Glucose 70 - 99 mg/dL 448(J) 856(D) 149(F)  BUN 6 - 20 mg/dL 11 12 11   Creatinine 0.61 - 1.24 mg/dL 0.26 3.78 5.88  Sodium 135 - 145 mmol/L 142 140 140  Potassium 3.5 - 5.1 mmol/L 3.5 3.8 3.6  Chloride 98 - 111 mmol/L 103 102 101  CO2 22 - 32 mmol/L 28 29 28   Calcium 8.9 - 10.3 mg/dL 7.8(L) 8.0(L) 7.9(L)  Total Protein 6.5 - 8.1 g/dL - - -  Total Bilirubin 0.3 - 1.2 mg/dL - - -  Alkaline Phos 38 - 126 U/L - - -  AST 15 - 41 U/L - - -  ALT 0 - 44 U/L - - -     Imaging studies: No new pertinent imaging studies   Assessment/Plan: (ICD-10's: K35.80) 48 y.o. male overall doing well without evidence of sepsis/peritonitis currently undergoing conservative management for acute appendicitis with prominent phlegmon and concern for possible developing abscess on CT 04/30   - Continue soft diet; NPO after midnight for CT tomorrow  - Continue IV Abx (Cipro/Flagyl)   - pain control prn (minimize narcotics); antiemetics prn  - monitor abdominal examination  - Will plan on repeat CT A/P tomorrow morning to further assess for drainable abscess   - medical management of comorbidities  - Hold DVT prophylaxis tomorrow   All of the above findings and recommendations were discussed with the patient, patient's family (Wife - Jodie via phone), and the medical team, and all of patient's and family's questions were answered to their expressed satisfaction.   -- Lynden Oxford, PA-C [name] 06/30/2018, 9:39  AM [phone] M-F: 7am - 4pm

## 2018-07-01 ENCOUNTER — Encounter: Payer: Self-pay | Admitting: Radiology

## 2018-07-01 ENCOUNTER — Inpatient Hospital Stay (HOSPITAL_COMMUNITY): Payer: BC Managed Care – PPO

## 2018-07-01 ENCOUNTER — Inpatient Hospital Stay: Payer: Medicare Other

## 2018-07-01 LAB — CBC WITH DIFFERENTIAL/PLATELET
Abs Immature Granulocytes: 0.13 10*3/uL — ABNORMAL HIGH (ref 0.00–0.07)
Basophils Absolute: 0 10*3/uL (ref 0.0–0.1)
Basophils Relative: 0 %
Eosinophils Absolute: 0.2 10*3/uL (ref 0.0–0.5)
Eosinophils Relative: 2 %
HCT: 32.6 % — ABNORMAL LOW (ref 39.0–52.0)
Hemoglobin: 10.2 g/dL — ABNORMAL LOW (ref 13.0–17.0)
Immature Granulocytes: 1 %
Lymphocytes Relative: 18 %
Lymphs Abs: 1.6 10*3/uL (ref 0.7–4.0)
MCH: 26.8 pg (ref 26.0–34.0)
MCHC: 31.3 g/dL (ref 30.0–36.0)
MCV: 85.8 fL (ref 80.0–100.0)
Monocytes Absolute: 1 10*3/uL (ref 0.1–1.0)
Monocytes Relative: 10 %
Neutro Abs: 6.4 10*3/uL (ref 1.7–7.7)
Neutrophils Relative %: 69 %
Platelets: 228 10*3/uL (ref 150–400)
RBC: 3.8 MIL/uL — ABNORMAL LOW (ref 4.22–5.81)
RDW: 14.9 % (ref 11.5–15.5)
WBC: 9.3 10*3/uL (ref 4.0–10.5)
nRBC: 0 % (ref 0.0–0.2)

## 2018-07-01 MED ORDER — IOHEXOL 300 MG/ML  SOLN
100.0000 mL | Freq: Once | INTRAMUSCULAR | Status: AC | PRN
  Administered 2018-07-01: 100 mL via INTRAVENOUS

## 2018-07-01 MED ORDER — CIPROFLOXACIN HCL 500 MG PO TABS: 500.0000 mg | ORAL_TABLET | Freq: Two times a day (BID) | ORAL | 0 refills | Status: AC

## 2018-07-01 MED ORDER — ONDANSETRON HCL 4 MG PO TABS: 4.0000 mg | ORAL_TABLET | Freq: Three times a day (TID) | ORAL | 0 refills | Status: DC | PRN

## 2018-07-01 MED ORDER — IOHEXOL 240 MG/ML SOLN
25.0000 mL | INTRAMUSCULAR | Status: AC
  Administered 2018-07-01 (×2): 25 mL via ORAL

## 2018-07-01 MED ORDER — IOHEXOL 300 MG/ML  SOLN
1.0000 mL | Freq: Once | INTRAMUSCULAR | Status: AC | PRN
  Administered 2018-07-01: 1 mL via INTRAVENOUS

## 2018-07-01 MED ORDER — OXYCODONE HCL 5 MG PO TABS: 5.0000 mg | ORAL_TABLET | Freq: Four times a day (QID) | ORAL | 0 refills | Status: DC | PRN

## 2018-07-01 MED ORDER — METRONIDAZOLE 500 MG PO TABS: 500.0000 mg | ORAL_TABLET | Freq: Three times a day (TID) | ORAL | 0 refills | Status: AC

## 2018-07-01 NOTE — Discharge Summary (Addendum)
Patient ID: [name] CroakWilliam O Cush [name]. MRN: [id number] DOB/AGE: 48-Aug-1972 48 y.o.  Admit date: 06/26/2018 Discharge date: 07/01/2018   Discharge Diagnoses:  Active Problems:   Acute appendicitis   Procedures:  None  Hospital Course:  Patient was admitted on 4/27 with acute appendicitis with a large size phlegmon.  We opted for conservative treatment and was initially NPO with IV fluid hydration and IV antibiotics.  He initially responded well and diet was advanced but developed a fever on 4/29.  He had repeat CT scan on 4/30 which showed a possible developing abscess but not a good target to drain.  CT scan was repeated today 5/2 which does not show an abscess and only a phlegmon.  His WBC is normal, he is asymptomatic without any abdominal pain, tolerating diet, and remained afebrile.  He will be discharged to home.  On exam, his abdomen is soft, nondistended, nontender to palpation.  He is in no acute distress with stable vital signs.  Consults: None  Disposition: Discharge disposition: 01-Home or Self Care       Discharge Instructions    Call MD for:  difficulty breathing, headache or visual disturbances   Complete by:  As directed    Call MD for:  persistant nausea and vomiting   Complete by:  As directed    Call MD for:  severe uncontrolled pain   Complete by:  As directed    Call MD for:  temperature >100.4   Complete by:  As directed    Diet - low sodium heart healthy   Complete by:  As directed    Discharge instructions   Complete by:  As directed    Please call office if any questions, concerns, worsening symptoms.   Driving Restrictions   Complete by:  As directed    Do not drive while taking narcotics for pain control.   Increase activity slowly   Complete by:  As directed      Allergies as of 07/01/2018      Reactions   Penicillins Anaphylaxis   Happened as a child. Has patient had a PCN reaction causing immediate rash, facial/tongue/throat swelling, SOB or  lightheadedness with hypotension: Yes Has patient had a PCN reaction causing severe rash involving mucus membranes or skin necrosis: No Has patient had a PCN reaction that required hospitalization: Unknown Has patient had a PCN reaction occurring within the last 10 years: No If all of the above answers are "NO", then may proceed with Cephalosporin use.   Sulfa Antibiotics Anaphylaxis   Other reaction(s): Unknown   Sulfasalazine Anaphylaxis   Azithromycin Nausea And Vomiting, Rash      Medication List    TAKE these medications   albuterol 108 (90 Base) MCG/ACT inhaler Commonly known as:  VENTOLIN HFA Inhale 2 puffs into the lungs every 6 (six) hours as needed for wheezing or shortness of breath.   atorvastatin 80 MG tablet Commonly known as:  LIPITOR Take 80 mg by mouth at bedtime.   benzonatate 100 MG capsule Commonly known as:  TESSALON Take 1 capsule (100 mg total) by mouth 3 (three) times daily.   calcium-vitamin D 500-200 MG-UNIT tablet Commonly known as:  Oscal 500/200 D-3 Take 1 tablet by mouth 2 (two) times daily.   cetirizine 10 MG tablet Commonly known as:  ZYRTEC Take 10 mg by mouth at bedtime.   ciprofloxacin 500 MG tablet Commonly known as:  Cipro Take 1 tablet (500 mg total) by mouth 2 (two) times daily  for 9 days.   escitalopram 10 MG tablet Commonly known as:  LEXAPRO Take 10 mg by mouth daily.   feeding supplement (ENSURE ENLIVE) Liqd Take 237 mLs by mouth 3 (three) times daily between meals.   glimepiride 4 MG tablet Commonly known as:  AMARYL Take 4 mg by mouth daily with breakfast.   losartan 50 MG tablet Commonly known as:  COZAAR Take 50 mg by mouth at bedtime.   magnesium oxide 400 (241.3 Mg) MG tablet Commonly known as:  MAG-OX Take 1 tablet (400 mg total) by mouth daily.   metoprolol succinate 50 MG 24 hr tablet Commonly known as:  TOPROL-XL Take 50 mg by mouth daily. Take with or immediately following a meal.   metroNIDAZOLE 500 MG  tablet Commonly known as:  Flagyl Take 1 tablet (500 mg total) by mouth 3 (three) times daily for 9 days.   mupirocin ointment 2 % Commonly known as:  BACTROBAN Apply 1 application topically 2 (two) times daily.   NIFEdipine 30 MG 24 hr tablet Commonly known as:  ADALAT CC Take 30 mg by mouth at bedtime.   omeprazole 40 MG capsule Commonly known as:  PRILOSEC Take 40 mg by mouth daily.   ondansetron 4 MG tablet Commonly known as:  ZOFRAN Take 4 mg by mouth every 8 (eight) hours as needed for nausea. What changed:  Another medication with the same name was added. Make sure you understand how and when to take each.   ondansetron 4 MG tablet Commonly known as:  Zofran Take 1 tablet (4 mg total) by mouth every 8 (eight) hours as needed for nausea or vomiting. What changed:  You were already taking a medication with the same name, and this prescription was added. Make sure you understand how and when to take each.   oxyCODONE 5 MG immediate release tablet Commonly known as:  Oxy IR/ROXICODONE Take 1 tablet (5 mg total) by mouth every 6 (six) hours as needed for severe pain.   Potassium Chloride ER 20 MEQ Tbcr Take 40 mEq by mouth daily for 4 days.   Rexulti 2 MG Tabs Generic drug:  Brexpiprazole Take 2 mg by mouth daily.   sitaGLIPtin-metformin 50-500 MG tablet Commonly known as:  JANUMET Take 1 tablet by mouth 2 (two) times daily with a meal.   tadalafil 20 MG tablet Commonly known as:  ADCIRCA/CIALIS Take 20 mg by mouth as needed.   Vitamin D (Ergocalciferol) 1.25 MG (50000 UT) Caps capsule Commonly known as:  DRISDOL Take 1 capsule (50,000 Units total) by mouth every 7 (seven) days.      Follow-up Information    Henrene Dodge, MD Follow up on 07/14/2018.   Specialty:  General Surgery Why:  Follow up on 07/14/18 with video virtual appointment. Contact information: [address] [place] [address] [phone]

## 2018-07-03 LAB — GLUCOSE, CAPILLARY
Glucose-Capillary: 146 mg/dL — ABNORMAL HIGH (ref 70–99)
Glucose-Capillary: 149 mg/dL — ABNORMAL HIGH (ref 70–99)
Glucose-Capillary: 190 mg/dL — ABNORMAL HIGH (ref 70–99)
Glucose-Capillary: 155 mg/dL — ABNORMAL HIGH (ref 70–99)

## 2018-07-14 ENCOUNTER — Telehealth (INDEPENDENT_AMBULATORY_CARE_PROVIDER_SITE_OTHER): Payer: BC Managed Care – PPO | Admitting: Surgery

## 2018-07-14 ENCOUNTER — Other Ambulatory Visit: Payer: Self-pay

## 2018-07-14 DIAGNOSIS — K3532 Acute appendicitis with perforation and localized peritonitis, without abscess: Secondary | ICD-10-CM

## 2018-07-14 NOTE — Progress Notes (Signed)
Virtual Visit via Telephone Note  I connected with [name] [name]. on 07/14/18 at 11:15 AM EDT by telephone and verified that I am speaking with the correct person using two identifiers.  Location: Patient: Home Provider: Office   I discussed the limitations, risks, security and privacy concerns of performing an evaluation and management service by telephone and the availability of in person appointments. I also discussed with the patient that there may be a patient responsible charge related to this service. The patient expressed understanding and agreed to proceed.  This service was provided via telemedicine.  The patient consented to the visit being carried via telemedicine.  Patient's location:  Home  Provider's location:  Office  Referring Provider:  None  People participating in this telemedicine visit:  Patient, his wife, and myself  Time spent:  15 minutes  History of Present Illness: Patient was hospitalized from 4/27 to 5/2 for ruptured appendicitis with phlegmon.  Was treated conservatively with IV antibiotics.  He presents for phone appointment for follow up.  Reports he's doing well, eating well, with good appetite and bowel function.  Denies any abdominal pain, nausea, vomiting, pain with po intake, fevers, or chills.   Observations/Objective: Patient appears comfortable in no acute distress, able to carry conversation without any respiratory distress.  Assessment and Plan: 48 yo male with ruptured appendicitis.  Discussed with patient that as he's approaching age for screening colonoscopy, I would like to refer him for colonoscopy prior to any surgery as a precaution to make sure a mass was not the cause of his appendicitis and no further resection is needed.  Patient request that it be done after July 1st due to insurance issues.  Will send referral to GI for colonoscopy for first week of July, and then patient will follow up with me to discuss results and plans  for surgery for his appendix.  Return precautions given to patient and he shows understanding.  Follow Up Instructions:    I discussed the assessment and treatment plan with the patient. The patient was provided an opportunity to ask questions and all were answered. The patient agreed with the plan and demonstrated an understanding of the instructions.   The patient was advised to call back or seek an in-person evaluation if the symptoms worsen or if the condition fails to improve as anticipated.  I provided 15 minutes of non-face-to-face time during this encounter.   Henrene Dodge, MD

## 2018-07-17 ENCOUNTER — Other Ambulatory Visit: Payer: Self-pay | Admitting: *Deleted

## 2018-07-17 DIAGNOSIS — Z1211 Encounter for screening for malignant neoplasm of colon: Secondary | ICD-10-CM

## 2018-07-17 NOTE — Progress Notes (Signed)
Referral to [facility] for colonoscopy.   Patient to be placed in the recalls for July 2020 for follow up with Dr. Aleen Campi.

## 2018-07-20 ENCOUNTER — Telehealth: Payer: Self-pay | Admitting: Gastroenterology

## 2018-07-20 NOTE — Telephone Encounter (Signed)
[name] [name] called stating we had called the patient to schedule a colonoscopy. Please call her to set up appointment.

## 2018-07-20 NOTE — Telephone Encounter (Signed)
Returned patients wife's call to schedule her husbands colonoscopy.  LVM for her to call the office to schedule.  Thanks [employer]

## 2018-07-25 ENCOUNTER — Other Ambulatory Visit: Payer: Self-pay

## 2018-07-25 ENCOUNTER — Telehealth: Payer: Self-pay | Admitting: Gastroenterology

## 2018-07-25 DIAGNOSIS — Z1211 Encounter for screening for malignant neoplasm of colon: Secondary | ICD-10-CM

## 2018-07-25 MED ORDER — NA SULFATE-K SULFATE-MG SULF 17.5-3.13-1.6 GM/177ML PO SOLN: 1.0000 | Freq: Once | ORAL | 0 refills | Status: AC

## 2018-07-25 NOTE — Telephone Encounter (Signed)
Gastroenterology Pre-Procedure Review  Request Date: 08/31/18 Requesting Physician: Dr. Maximino Greenland  PATIENT REVIEW QUESTIONS: The patient responded to the following health history questions as indicated:    1. Are you having any GI issues? no 2. Do you have a personal history of Polyps?NO 3. Do you have a family history of Colon Cancer or Polyps?MOTHER MAY HAVE HAD STOMACH CANCER 4. Diabetes Mellitus?YES ORAL MEDS 5. Joint replacements in the past 12 months?NO 6. Major health problems in the past 3 months?YES INFLAMED APPENDIX 2 WEEKS AGO  7. Any artificial heart valves, MVP, or defibrillator?NO    MEDICATIONS & ALLERGIES:    Patient reports the following regarding taking any anticoagulation/antiplatelet therapy:   Plavix, Coumadin, Eliquis, Xarelto, Lovenox, Pradaxa, Brilinta, or Effient? nO Aspirin?NO Patient confirms/reports the following medications:  Current Outpatient Medications  Medication Sig Dispense Refill  . albuterol (PROVENTIL HFA;VENTOLIN HFA) 108 (90 Base) MCG/ACT inhaler Inhale 2 puffs into the lungs every 6 (six) hours as needed for wheezing or shortness of breath.    Marland Kitchen atorvastatin (LIPITOR) 80 MG tablet Take 80 mg by mouth at bedtime.     . benzonatate (TESSALON) 100 MG capsule Take 1 capsule (100 mg total) by mouth 3 (three) times daily. (Patient not taking: Reported on 06/26/2018) 20 capsule 0  . Brexpiprazole (REXULTI) 2 MG TABS Take 2 mg by mouth daily.     . calcium-vitamin D (OSCAL 500/200 D-3) 500-200 MG-UNIT tablet Take 1 tablet by mouth 2 (two) times daily. 180 tablet 3  . cetirizine (ZYRTEC) 10 MG tablet Take 10 mg by mouth at bedtime.     Marland Kitchen escitalopram (LEXAPRO) 10 MG tablet Take 10 mg by mouth daily.    . feeding supplement, ENSURE ENLIVE, (ENSURE ENLIVE) LIQD Take 237 mLs by mouth 3 (three) times daily between meals. 237 mL 12  . glimepiride (AMARYL) 4 MG tablet Take 4 mg by mouth daily with breakfast.    . losartan (COZAAR) 50 MG tablet Take 50 mg by  mouth at bedtime.     . magnesium oxide (MAG-OX) 400 (241.3 Mg) MG tablet Take 1 tablet (400 mg total) by mouth daily. 30 tablet 0  . metoprolol succinate (TOPROL-XL) 50 MG 24 hr tablet Take 50 mg by mouth daily. Take with or immediately following a meal.    . mupirocin ointment (BACTROBAN) 2 % Apply 1 application topically 2 (two) times daily.    Marland Kitchen NIFEdipine (PROCARDIA-XL/ADALAT CC) 30 MG 24 hr tablet Take 30 mg by mouth at bedtime.     Marland Kitchen omeprazole (PRILOSEC) 40 MG capsule Take 40 mg by mouth daily.    . ondansetron (ZOFRAN) 4 MG tablet Take 4 mg by mouth every 8 (eight) hours as needed for nausea.    . ondansetron (ZOFRAN) 4 MG tablet Take 1 tablet (4 mg total) by mouth every 8 (eight) hours as needed for nausea or vomiting. 20 tablet 0  . oxyCODONE (OXY IR/ROXICODONE) 5 MG immediate release tablet Take 1 tablet (5 mg total) by mouth every 6 (six) hours as needed for severe pain. 15 tablet 0  . potassium chloride 20 MEQ TBCR Take 40 mEq by mouth daily for 4 days. 8 tablet 0  . sitaGLIPtin-metformin (JANUMET) 50-500 MG tablet Take 1 tablet by mouth 2 (two) times daily with a meal.    . tadalafil (ADCIRCA/CIALIS) 20 MG tablet Take 20 mg by mouth as needed.    . Vitamin D, Ergocalciferol, (DRISDOL) 50000 units CAPS capsule Take 1 capsule (50,000 Units total) by  mouth every 7 (seven) days. 5 capsule 0   No current facility-administered medications for this visit.     Patient confirms/reports the following allergies:  Allergies  Allergen Reactions  . Penicillins Anaphylaxis    Happened as a child. Has patient had a PCN reaction causing immediate rash, facial/tongue/throat swelling, SOB or lightheadedness with hypotension: Yes Has patient had a PCN reaction causing severe rash involving mucus membranes or skin necrosis: No Has patient had a PCN reaction that required hospitalization: Unknown Has patient had a PCN reaction occurring within the last 10 years: No If all of the above answers are  "NO", then may proceed with Cephalosporin use.  . Sulfa Antibiotics Anaphylaxis    Other reaction(s): Unknown  . Sulfasalazine Anaphylaxis  . Azithromycin Nausea And Vomiting and Rash    No orders of the defined types were placed in this encounter.   AUTHORIZATION INFORMATION Primary Insurance: 1D#: Group #:  Secondary Insurance: 1D#: Group #:  SCHEDULE INFORMATION: Date: 08/31/18 Time: Location:[facility]

## 2018-07-25 NOTE — Telephone Encounter (Signed)
Pt wife is calling to schedule pt colonoscopy

## 2018-08-24 ENCOUNTER — Telehealth: Payer: Self-pay | Admitting: Surgery

## 2018-08-24 NOTE — Telephone Encounter (Signed)
Patient is calling and is asking how soon he would be able to have surgery done with Dr. Hampton Abbot after his colonoscopy that he is having done on 08/31/2018.please call patient and advise.

## 2018-08-28 ENCOUNTER — Other Ambulatory Visit: Admission: RE | Admit: 2018-08-28 | Payer: BC Managed Care – PPO | Source: Ambulatory Visit

## 2018-08-30 ENCOUNTER — Telehealth: Payer: Self-pay | Admitting: Gastroenterology

## 2018-08-30 ENCOUNTER — Other Ambulatory Visit: Payer: Self-pay

## 2018-08-30 DIAGNOSIS — Z1211 Encounter for screening for malignant neoplasm of colon: Secondary | ICD-10-CM

## 2018-08-30 NOTE — Telephone Encounter (Signed)
Pt wife left vm she states pt was supposed to have a procedure tomorrow but when they called to find out what time they were informed procedure was cancelled because pt needed Covid19 testing she would like a call ASAP

## 2018-08-30 NOTE — Telephone Encounter (Signed)
Patients wife contacted office regarding not being informed of mandatory COVID testing.  I apologized for the inconvenience, and informed her that we make every effort to make sure patients are aware upon scheduling procedures and apologized that she did not receive that communication.  She calmed down, and apologized as well.  Her husband has been rescheduled for 09/11/18.  I've emailed her dietary instructions and COVID testing information.  This information will be placed in the mail along with a $5 service recovery gift card from [employer].  I provided [name] [name] my direct phone number and asked her to call me back if she has any questions.  Thanks [employer]

## 2018-08-31 ENCOUNTER — Encounter: Admission: RE | Payer: Self-pay | Source: Home / Self Care

## 2018-08-31 ENCOUNTER — Ambulatory Visit
Admission: RE | Admit: 2018-08-31 | Payer: BC Managed Care – PPO | Source: Home / Self Care | Admitting: Gastroenterology

## 2018-08-31 SURGERY — COLONOSCOPY WITH PROPOFOL
Anesthesia: General

## 2018-09-06 ENCOUNTER — Ambulatory Visit: Payer: BC Managed Care – PPO | Admitting: Surgery

## 2018-09-07 ENCOUNTER — Other Ambulatory Visit
Admission: RE | Admit: 2018-09-07 | Discharge: 2018-09-07 | Disposition: A | Discharge status: Home / Self Care | Payer: Medicare Other | Source: Ambulatory Visit | Attending: Gastroenterology | Admitting: Gastroenterology

## 2018-09-07 ENCOUNTER — Other Ambulatory Visit: Payer: Self-pay

## 2018-09-07 DIAGNOSIS — Z1159 Encounter for screening for other viral diseases: Secondary | ICD-10-CM | POA: Insufficient documentation

## 2018-09-07 DIAGNOSIS — Z01812 Encounter for preprocedural laboratory examination: Secondary | ICD-10-CM | POA: Insufficient documentation

## 2018-09-08 ENCOUNTER — Encounter: Payer: Self-pay | Admitting: *Deleted

## 2018-09-08 LAB — SARS CORONAVIRUS 2 (TAT 6-24 HRS): SARS Coronavirus 2: NEGATIVE

## 2018-09-11 ENCOUNTER — Encounter: Payer: Self-pay | Admitting: Anesthesiology

## 2018-09-11 ENCOUNTER — Ambulatory Visit: Payer: Medicare Other | Admitting: Certified Registered"

## 2018-09-11 ENCOUNTER — Other Ambulatory Visit: Payer: Self-pay | Admitting: *Deleted

## 2018-09-11 ENCOUNTER — Ambulatory Visit
Admission: RE | Admit: 2018-09-11 | Discharge: 2018-09-11 | Disposition: A | Discharge status: Home / Self Care | Payer: Medicare Other | Source: Ambulatory Visit | Attending: Gastroenterology | Admitting: Gastroenterology

## 2018-09-11 ENCOUNTER — Ambulatory Visit
Admission: RE | Admit: 2018-09-11 | Discharge: 2018-09-11 | Disposition: A | Discharge status: Home / Self Care | Payer: Medicare Other | Source: Ambulatory Visit | Attending: Surgery | Admitting: Surgery

## 2018-09-11 ENCOUNTER — Telehealth: Payer: Self-pay | Admitting: Gastroenterology

## 2018-09-11 ENCOUNTER — Other Ambulatory Visit: Payer: Self-pay | Admitting: Gastroenterology

## 2018-09-11 ENCOUNTER — Encounter
Admission: RE | Disposition: A | Discharge status: Home / Self Care | Payer: Self-pay | Source: Ambulatory Visit | Attending: Gastroenterology

## 2018-09-11 DIAGNOSIS — K3580 Unspecified acute appendicitis: Secondary | ICD-10-CM

## 2018-09-11 DIAGNOSIS — I10 Essential (primary) hypertension: Secondary | ICD-10-CM | POA: Diagnosis not present

## 2018-09-11 DIAGNOSIS — G4730 Sleep apnea, unspecified: Secondary | ICD-10-CM | POA: Insufficient documentation

## 2018-09-11 DIAGNOSIS — Z538 Procedure and treatment not carried out for other reasons: Secondary | ICD-10-CM | POA: Insufficient documentation

## 2018-09-11 DIAGNOSIS — E119 Type 2 diabetes mellitus without complications: Secondary | ICD-10-CM | POA: Diagnosis not present

## 2018-09-11 DIAGNOSIS — K219 Gastro-esophageal reflux disease without esophagitis: Secondary | ICD-10-CM | POA: Insufficient documentation

## 2018-09-11 DIAGNOSIS — Z1211 Encounter for screening for malignant neoplasm of colon: Secondary | ICD-10-CM | POA: Insufficient documentation

## 2018-09-11 DIAGNOSIS — Z79899 Other long term (current) drug therapy: Secondary | ICD-10-CM | POA: Diagnosis not present

## 2018-09-11 LAB — GLUCOSE, CAPILLARY: Glucose-Capillary: 149 mg/dL — ABNORMAL HIGH (ref 70–99)

## 2018-09-11 SURGERY — COLONOSCOPY WITH PROPOFOL
Anesthesia: General

## 2018-09-11 MED ORDER — PROPOFOL 500 MG/50ML IV EMUL
INTRAVENOUS | Status: AC
  Filled 2018-09-11: qty 50

## 2018-09-11 MED ORDER — IOHEXOL 300 MG/ML  SOLN
100.0000 mL | Freq: Once | INTRAMUSCULAR | Status: AC | PRN
  Administered 2018-09-11: 11:00:00 100 mL via INTRAVENOUS

## 2018-09-11 MED ORDER — POLYETHYLENE GLYCOL 3350 17 GM/SCOOP PO POWD: 1.0000 | Freq: Once | ORAL | 0 refills | Status: AC

## 2018-09-11 MED ORDER — PROPOFOL 500 MG/50ML IV EMUL
INTRAVENOUS | Status: AC
  Filled 2018-09-11: qty 300

## 2018-09-11 MED ORDER — ONDANSETRON HCL 4 MG PO TABS: 4.0000 mg | ORAL_TABLET | Freq: Four times a day (QID) | ORAL | 0 refills | Status: DC | PRN

## 2018-09-11 MED ORDER — SODIUM CHLORIDE 0.9 % IV SOLN
INTRAVENOUS | Status: DC
  Administered 2018-09-11: 08:00:00 via INTRAVENOUS

## 2018-09-11 NOTE — Anesthesia Preprocedure Evaluation (Addendum)
Anesthesia Evaluation  Patient identified by MRN, date of birth, ID band Patient awake    Reviewed: Allergy & Precautions, NPO status , Patient's Chart, lab work & pertinent test results, reviewed documented beta blocker date and time   Airway Mallampati: III  TM Distance: <3 FB     Dental  (+) Chipped, Caps   Pulmonary sleep apnea ,    Pulmonary exam normal        Cardiovascular hypertension, Pt. on medications and Pt. on home beta blockers Normal cardiovascular exam     Neuro/Psych PSYCHIATRIC DISORDERS Anxiety Depression negative neurological ROS     GI/Hepatic Neg liver ROS, GERD  Medicated,  Endo/Other  diabetes  Renal/GU negative Renal ROS  negative genitourinary   Musculoskeletal negative musculoskeletal ROS (+)   Abdominal Normal abdominal exam  (+)   Peds negative pediatric ROS (+)  Hematology negative hematology ROS (+)   Anesthesia Other Findings   Reproductive/Obstetrics                            Anesthesia Physical Anesthesia Plan  ASA: III  Anesthesia Plan: General   Post-op Pain Management:    Induction: Intravenous  PONV Risk Score and Plan: Propofol infusion  Airway Management Planned: Nasal Cannula  Additional Equipment:   Intra-op Plan:   Post-operative Plan:   Informed Consent: I have reviewed the patients History and Physical, chart, labs and discussed the procedure including the risks, benefits and alternatives for the proposed anesthesia with the patient or authorized representative who has indicated his/her understanding and acceptance.     Dental advisory given  Plan Discussed with: CRNA and Surgeon  Anesthesia Plan Comments:         Anesthesia Quick Evaluation

## 2018-09-11 NOTE — Telephone Encounter (Signed)
Called patient and spoke to patient as well as his wife to discuss about the CT scan results.  Overall the changes from appendicitis have improved, the phlegmon has resolved.  Appendix remains dilated as well as there was presence of small mesenteric lymph nodes in the right lower quadrant.  We will proceed with colonoscopy tomorrow Advised patient to stay on clear liquid diet He prefers MiraLAX prep, MiraLAX sent to his pharmacy Advised him also to take Zofran to prevent nausea and vomiting before starting the prep and every 6 hours as needed  [name], MD [place]  [place], [place] [address]  Main: [phone]  Fax: [phone] Pager: [phone]

## 2018-09-11 NOTE — Progress Notes (Signed)
Patient was seen in the Endo suite for colonoscopy.  Referral was placed for screening colonoscopy.  He was not seen in the GI clinic prior to colonoscopy.  Patient had history of ruptured appendicitis with phlegmon based on the CT scan from 07/01/2018.  He was treated conservatively with antibiotics.  He had a follow-up with Dr. Hampton Abbot in the office.  Patient is clinically asymptomatic today.  However, given his history of ruptured appendicitis with phlegmon, he has high risk of perforation with a colonoscopy.  Therefore, I spoke to Dr. Hampton Abbot and after discussing with family, decision was made to postpone the colonoscopy until we have a follow-up CT scan.  Colonoscopy will be diagnostic to evaluate for any cancer prior to proceeding with surgery  Patient and his wife expressed understanding of the plan  [name], MD [facility]  [place], [place] [address]  Main: [phone]  Fax: [phone] Pager: [phone]

## 2018-09-11 NOTE — Progress Notes (Signed)
Ct abdomen / pelvis stat per dr. Hampton Abbot

## 2018-09-11 NOTE — Anesthesia Post-op Follow-up Note (Signed)
Anesthesia QCDR form completed.        

## 2018-09-12 ENCOUNTER — Ambulatory Visit: Payer: Medicare Other | Admitting: Anesthesiology

## 2018-09-12 ENCOUNTER — Other Ambulatory Visit: Payer: Self-pay

## 2018-09-12 ENCOUNTER — Encounter
Admission: RE | Disposition: A | Discharge status: Home / Self Care | Payer: Self-pay | Source: Home / Self Care | Attending: Gastroenterology

## 2018-09-12 ENCOUNTER — Ambulatory Visit
Admission: RE | Admit: 2018-09-12 | Discharge: 2018-09-12 | Disposition: A | Discharge status: Home / Self Care | Payer: Medicare Other | Attending: Gastroenterology | Admitting: Gastroenterology

## 2018-09-12 DIAGNOSIS — E119 Type 2 diabetes mellitus without complications: Secondary | ICD-10-CM | POA: Insufficient documentation

## 2018-09-12 DIAGNOSIS — I10 Essential (primary) hypertension: Secondary | ICD-10-CM | POA: Insufficient documentation

## 2018-09-12 DIAGNOSIS — K219 Gastro-esophageal reflux disease without esophagitis: Secondary | ICD-10-CM | POA: Diagnosis not present

## 2018-09-12 DIAGNOSIS — J45909 Unspecified asthma, uncomplicated: Secondary | ICD-10-CM | POA: Diagnosis not present

## 2018-09-12 DIAGNOSIS — G4733 Obstructive sleep apnea (adult) (pediatric): Secondary | ICD-10-CM | POA: Insufficient documentation

## 2018-09-12 DIAGNOSIS — Z6841 Body Mass Index (BMI) 40.0 and over, adult: Secondary | ICD-10-CM | POA: Diagnosis not present

## 2018-09-12 DIAGNOSIS — F329 Major depressive disorder, single episode, unspecified: Secondary | ICD-10-CM | POA: Insufficient documentation

## 2018-09-12 DIAGNOSIS — Z79899 Other long term (current) drug therapy: Secondary | ICD-10-CM | POA: Insufficient documentation

## 2018-09-12 DIAGNOSIS — E785 Hyperlipidemia, unspecified: Secondary | ICD-10-CM | POA: Diagnosis not present

## 2018-09-12 DIAGNOSIS — Z7951 Long term (current) use of inhaled steroids: Secondary | ICD-10-CM | POA: Insufficient documentation

## 2018-09-12 DIAGNOSIS — Z8719 Personal history of other diseases of the digestive system: Secondary | ICD-10-CM

## 2018-09-12 DIAGNOSIS — K644 Residual hemorrhoidal skin tags: Secondary | ICD-10-CM | POA: Insufficient documentation

## 2018-09-12 DIAGNOSIS — R933 Abnormal findings on diagnostic imaging of other parts of digestive tract: Secondary | ICD-10-CM | POA: Diagnosis present

## 2018-09-12 DIAGNOSIS — F419 Anxiety disorder, unspecified: Secondary | ICD-10-CM | POA: Insufficient documentation

## 2018-09-12 HISTORY — PX: COLONOSCOPY WITH PROPOFOL: SHX5780

## 2018-09-12 LAB — GLUCOSE, CAPILLARY: Glucose-Capillary: 124 mg/dL — ABNORMAL HIGH (ref 70–99)

## 2018-09-12 SURGERY — COLONOSCOPY WITH PROPOFOL
Anesthesia: General

## 2018-09-12 MED ORDER — PROPOFOL 10 MG/ML IV BOLUS
INTRAVENOUS | Status: DC | PRN
  Administered 2018-09-12: 40 mg via INTRAVENOUS

## 2018-09-12 MED ORDER — SODIUM CHLORIDE 0.9 % IV SOLN: INTRAVENOUS | Status: DC

## 2018-09-12 MED ORDER — PROPOFOL 500 MG/50ML IV EMUL
INTRAVENOUS | Status: DC | PRN
  Administered 2018-09-12: 175 ug/kg/min via INTRAVENOUS

## 2018-09-12 MED ORDER — SODIUM CHLORIDE 0.9 % IV SOLN
INTRAVENOUS | Status: DC
  Administered 2018-09-12: 15:00:00 via INTRAVENOUS

## 2018-09-12 NOTE — Anesthesia Preprocedure Evaluation (Addendum)
Anesthesia Evaluation  Patient identified by MRN, date of birth, ID band Patient awake    Reviewed: Allergy & Precautions, H&P , NPO status , Patient's Chart, lab work & pertinent test results  Airway Mallampati: IV  TM Distance: >3 FB   Mouth opening: Limited Mouth Opening Comment: Very small mouth opening, <3 FB Large, coarse facial features  Dental  (+) Poor Dentition   Pulmonary asthma , sleep apnea and Continuous Positive Airway Pressure Ventilation , neg COPD, neg recent URI,           Cardiovascular hypertension, On Medications      Neuro/Psych PSYCHIATRIC DISORDERS Anxiety Depression negative neurological ROS     GI/Hepatic Neg liver ROS, GERD  Controlled,  Endo/Other  diabetes, Type 2, Oral Hypoglycemic AgentsMorbid obesity  Renal/GU negative Renal ROS  negative genitourinary   Musculoskeletal   Abdominal   Peds  Hematology negative hematology ROS (+)   Anesthesia Other Findings Past Medical History: No date: Acne vulgaris No date: Anxiety No date: Depression No date: Diabetes mellitus without complication (HCC) No date: GERD (gastroesophageal reflux disease) No date: Hyperlipidemia No date: Hypertension No date: OSA (obstructive sleep apnea)  Past Surgical History: No date: Gum Graft  BMI    Body Mass Index: 41.96 kg/m      Reproductive/Obstetrics negative OB ROS                            Anesthesia Physical Anesthesia Plan  ASA: III  Anesthesia Plan: General   Post-op Pain Management:    Induction:   PONV Risk Score and Plan: Propofol infusion and TIVA  Airway Management Planned: Simple Face Mask and Natural Airway  Additional Equipment:   Intra-op Plan:   Post-operative Plan:   Informed Consent: I have reviewed the patients History and Physical, chart, labs and discussed the procedure including the risks, benefits and alternatives for the proposed  anesthesia with the patient or authorized representative who has indicated his/her understanding and acceptance.     Dental Advisory Given  Plan Discussed with: Anesthesiologist and CRNA  Anesthesia Plan Comments:         Anesthesia Quick Evaluation

## 2018-09-12 NOTE — Op Note (Addendum)
[facility] Gastroenterology Patient Name: [name] [name] Procedure Date: 09/12/2018 3:48 PM MRN: [id number] Account #: [id number] Date of Birth: Mar 08, 1970 Admit Type: Outpatient Age: 48 Room: [facility] ENDO ROOM 4 Gender: Male Note Status: Finalized Procedure:            Colonoscopy Indications:          Abnormal CT of the GI tract, , h/o appendicitis, r/o                        cancer Providers:            Lin Landsman MD, MD Referring MD:         Leonie Douglas. Doy Hutching, MD (Referring MD) Medicines:            Monitored Anesthesia Care Complications:        No immediate complications. Estimated blood loss: None. Procedure:            Pre-Anesthesia Assessment:                       - Prior to the procedure, a History and Physical was                        performed, and patient medications and allergies were                        reviewed. The patient is competent. The risks and                        benefits of the procedure and the sedation options and                        risks were discussed with the patient. All questions                        were answered and informed consent was obtained.                        Patient identification and proposed procedure were                        verified by the physician, the nurse, the                        anesthesiologist, the anesthetist and the technician in                        the pre-procedure area in the procedure room in the                        endoscopy suite. Mental Status Examination: alert and                        oriented. Airway Examination: normal oropharyngeal                        airway and neck mobility. Respiratory Examination:                        clear to auscultation. CV Examination: normal.  Prophylactic Antibiotics: The patient does not require                        prophylactic antibiotics. Prior Anticoagulants: The                        patient has  taken no previous anticoagulant or                        antiplatelet agents. ASA Grade Assessment: III - A                        patient with severe systemic disease. After reviewing                        the risks and benefits, the patient was deemed in                        satisfactory condition to undergo the procedure. The                        anesthesia plan was to use monitored anesthesia care                        (MAC). Immediately prior to administration of                        medications, the patient was re-assessed for adequacy                        to receive sedatives. The heart rate, respiratory rate,                        oxygen saturations, blood pressure, adequacy of                        pulmonary ventilation, and response to care were                        monitored throughout the procedure. The physical status                        of the patient was re-assessed after the procedure.                       After obtaining informed consent, the colonoscope was                        passed under direct vision. Throughout the procedure,                        the patient's blood pressure, pulse, and oxygen                        saturations were monitored continuously. The                        Colonoscope was introduced through the anus and  advanced to the the cecum, identified by appendiceal                        orifice and ileocecal valve. The colonoscopy was                        unusually difficult due to significant looping and the                        patient's body habitus. Successful completion of the                        procedure was aided by applying abdominal pressure. The                        patient tolerated the procedure well. The quality of                        the bowel preparation was fair. Findings:      Skin tags were found on perianal exam.      The colon (entire examined portion) appeared normal.       Non-bleeding external hemorrhoids were found during retroflexion. The       hemorrhoids were large. Impression:           - Preparation of the colon was fair.                       - Perianal skin tags found on perianal exam.                       - The entire examined colon is normal.                       - Non-bleeding external hemorrhoids.                       - No specimens collected. Recommendation:       - Discharge patient to home (with escort).                       - Resume previous diet today.                       - Continue present medications.                       - Follow up with Dr Hampton Abbot for appendectomy                       - Await pathology results.                       - Repeat colonoscopy for screening purposes at age 74. Procedure Code(s):    --- Professional ---                       [phone], Colonoscopy, flexible; diagnostic, including                        collection of specimen(s) by brushing or washing, when  performed (separate procedure) Diagnosis Code(s):    --- Professional ---                       K64.4, Residual hemorrhoidal skin tags                       R93.3, Abnormal findings on diagnostic imaging of other                        parts of digestive tract CPT copyright 2019 American Medical Association. All rights reserved. The codes documented in this report are preliminary and upon coder review may  be revised to meet current compliance requirements. Dr. Ulyess Mort Lin Landsman MD, MD 09/12/2018 4:15:21 PM This report has been signed electronically. Number of Addenda: 0 Note Initiated On: 09/12/2018 3:48 PM Scope Withdrawal Time: 0 hours 10 minutes 27 seconds  Total Procedure Duration: 0 hours 15 minutes 50 seconds  Estimated Blood Loss: Estimated blood loss: none.      [facility]

## 2018-09-12 NOTE — Anesthesia Procedure Notes (Signed)
Date/Time: 09/12/2018 3:54 PM Performed by: Johnna Acosta, CRNA Pre-anesthesia Checklist: Patient identified, Emergency Drugs available, Suction available, Patient being monitored and Timeout performed Patient Re-evaluated:Patient Re-evaluated prior to induction Oxygen Delivery Method: Nasal cannula Preoxygenation: Pre-oxygenation with 100% oxygen Induction Type: IV induction

## 2018-09-12 NOTE — Anesthesia Post-op Follow-up Note (Signed)
Anesthesia QCDR form completed.        

## 2018-09-12 NOTE — H&P (Signed)
[name] Darby, MD [address]  [place]  [place], [place] [address]  Main: [phone]  Fax: [phone] Pager: [phone]  Primary Care Physician:  [name] Crouch, MD Primary Gastroenterologist:  Dr. Cephas [name]  Pre-Procedure History & Physical: HPI:  [name] [name]. is a 48 y.o. male is here for an colonoscopy.   Past Medical History:  Diagnosis Date  . Acne vulgaris   . Anxiety   . Depression   . Diabetes mellitus without complication ([place])   . GERD (gastroesophageal reflux disease)   . Hyperlipidemia   . Hypertension   . OSA (obstructive sleep apnea)     Past Surgical History:  Procedure Laterality Date  . Gum Graft      Prior to Admission medications   Medication Sig Start Date End Date Taking? Authorizing Provider  albuterol (PROVENTIL HFA;VENTOLIN HFA) 108 (90 Base) MCG/ACT inhaler Inhale 2 puffs into the lungs every 6 (six) hours as needed for wheezing or shortness of breath.   Yes [provider]  atorvastatin (LIPITOR) 80 MG tablet Take 80 mg by mouth at bedtime.    Yes [provider]  Brexpiprazole (REXULTI) 2 MG TABS Take 2 mg by mouth daily.    Yes [provider]  cetirizine (ZYRTEC) 10 MG tablet Take 10 mg by mouth at bedtime.    Yes [provider]  escitalopram (LEXAPRO) 10 MG tablet Take 10 mg by mouth daily.   Yes [provider]  feeding supplement, ENSURE ENLIVE, (ENSURE ENLIVE) LIQD Take 237 mLs by mouth 3 (three) times daily between meals. 08/07/17  Yes Mody, [name], MD  glimepiride (AMARYL) 4 MG tablet Take 4 mg by mouth daily with breakfast.   Yes [provider]  losartan (COZAAR) 50 MG tablet Take 50 mg by mouth at bedtime.    Yes [provider]  magnesium oxide (MAG-OX) 400 (241.3 Mg) MG tablet Take 1 tablet (400 mg total) by mouth daily. 08/08/17  Yes Mody, Ulice Bold, MD  metoprolol succinate (TOPROL-XL) 50 MG 24 hr tablet Take 50 mg by mouth daily. Take with or  immediately following a meal.   Yes [provider]  mupirocin ointment (BACTROBAN) 2 % Apply 1 application topically 2 (two) times daily. 05/01/18  Yes [provider]  NIFEdipine (PROCARDIA-XL/ADALAT CC) 30 MG 24 hr tablet Take 30 mg by mouth at bedtime.    Yes [provider]  omeprazole (PRILOSEC) 40 MG capsule Take 40 mg by mouth daily.   Yes [provider]  ondansetron (ZOFRAN) 4 MG tablet Take 1 tablet (4 mg total) by mouth every 8 (eight) hours as needed for nausea or vomiting. 07/01/18  Yes [name], [name] Bath, MD  ondansetron (ZOFRAN) 4 MG tablet Take 1 tablet (4 mg total) by mouth every 6 (six) hours as needed for up to 7 doses for nausea. 09/11/18  Yes [name] [name], Tally Due, MD  oxyCODONE (OXY IR/ROXICODONE) 5 MG immediate release tablet Take 1 tablet (5 mg total) by mouth every 6 (six) hours as needed for severe pain. 07/01/18  Yes [name], [name] Bath, MD  sitaGLIPtin-metformin (JANUMET) 50-500 MG tablet Take 1 tablet by mouth 2 (two) times daily with a meal.   Yes [provider]  tadalafil (ADCIRCA/CIALIS) 20 MG tablet Take 20 mg by mouth as needed. 09/29/17  Yes [provider]  Vitamin D, Ergocalciferol, (DRISDOL) 50000 units CAPS capsule Take 1 capsule (50,000 Units total) by mouth every 7 (seven) days. 08/12/17  Yes Mody, [name],  MD  benzonatate (TESSALON) 100 MG capsule Take 1 capsule (100 mg total) by mouth 3 (three) times daily. Patient not taking: Reported on 06/26/2018 08/07/17   [name] SaranMody, [name], MD  calcium-vitamin D (OSCAL 500/200 D-3) 500-200 MG-UNIT tablet Take 1 tablet by mouth 2 (two) times daily. 08/07/17 08/07/18  [name] SaranMody, [name], MD  potassium chloride 20 MEQ TBCR Take 40 mEq by mouth daily for 4 days. 08/07/17 08/11/17  [name] SaranMody, [name], MD    Allergies as of 09/11/2018 - Review Complete 09/11/2018  Allergen Reaction Noted  . Penicillins Anaphylaxis 08/01/2013  . Sulfa antibiotics Anaphylaxis 08/01/2013  . Sulfasalazine Anaphylaxis 01/22/2015  .  Azithromycin Nausea And Vomiting and Rash 08/01/2013    Family History  Problem Relation Age of Onset  . Colon cancer Mother   . CAD Father   . Diabetes Sister     Social History   Socioeconomic History  . Marital status: Married    Spouse name: Not on file  . Number of children: Not on file  . Years of education: Not on file  . Highest education level: Not on file  Occupational History  . Not on file  Social Needs  . Financial resource strain: Not on file  . Food insecurity    Worry: Not on file    Inability: Not on file  . Transportation needs    Medical: Not on file    Non-medical: Not on file  Tobacco Use  . Smoking status: Never Smoker  . Smokeless tobacco: Never Used  Substance and Sexual Activity  . Alcohol use: No  . Drug use: No  . Sexual activity: Not on file  Lifestyle  . Physical activity    Days per week: Not on file    Minutes per session: Not on file  . Stress: Not on file  Relationships  . Social Musicianconnections    Talks on phone: Not on file    Gets together: Not on file    Attends religious service: Not on file    Active member of club or organization: Not on file    Attends meetings of clubs or organizations: Not on file    Relationship status: Not on file  . Intimate partner violence    Fear of current or ex partner: Not on file    Emotionally abused: Not on file    Physically abused: Not on file    Forced sexual activity: Not on file  Other Topics Concern  . 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 Neck:  Supple; no masses or thyromegaly. Lungs:  Clear throughout to  auscultation.    Heart:  Regular rate and rhythm. Abdomen:  Soft, nontender and nondistended. Normal bowel sounds, without guarding, and without rebound.   Neurologic:  Alert and  oriented x4;  grossly normal neurologically.  Impression/Plan: Terrilee CroakWilliam O Wigal Jr. is here for an colonoscopy to be performed for dilated appendix, h/o appendicitis  Risks, benefits, limitations, and alternatives regarding  colonoscopy have been reviewed with the patient.  Questions have been answered.  All parties agreeable.   Lannette Donathohini Crews Mccollam, MD  09/12/2018, 2:50 PM

## 2018-09-12 NOTE — Transfer of Care (Signed)
Immediate Anesthesia Transfer of Care Note  Patient: Lola Lofaro.  Procedure(s) Performed: COLONOSCOPY WITH PROPOFOL (N/A )  Patient Location: PACU  Anesthesia Type:General  Level of Consciousness: awake, alert  and oriented  Airway & Oxygen Therapy: Patient Spontanous Breathing and Patient connected to nasal cannula oxygen  Post-op Assessment: Report given to RN and Post -op Vital signs reviewed and stable  Post vital signs: Reviewed and stable  Last Vitals:  Vitals Value Taken Time  BP 137/85 09/12/18 1618  Temp 36.2 C 09/12/18 1618  Pulse 95 09/12/18 1618  Resp 16 09/12/18 1618  SpO2 100 % 09/12/18 1618    Last Pain:  Vitals:   09/12/18 1618  TempSrc: Temporal  PainSc:          Complications: No apparent anesthesia complications

## 2018-09-13 ENCOUNTER — Encounter: Payer: Self-pay | Admitting: Gastroenterology

## 2018-09-13 NOTE — Anesthesia Postprocedure Evaluation (Signed)
Anesthesia Post Note  Patient: [name] [name].  Procedure(s) Performed: COLONOSCOPY WITH PROPOFOL (N/A )  Patient location during evaluation: PACU Anesthesia Type: General Level of consciousness: awake and alert Pain management: pain level controlled Vital Signs Assessment: post-procedure vital signs reviewed and stable Respiratory status: spontaneous breathing, nonlabored ventilation and respiratory function stable Cardiovascular status: blood pressure returned to baseline and stable Postop Assessment: no apparent nausea or vomiting Anesthetic complications: no     Last Vitals:  Vitals:   09/12/18 1628 09/12/18 1638  BP: 132/87 (!) 142/81  Pulse: 85 71  Resp: 20 18  Temp:    SpO2: 99% 98%    Last Pain:  Vitals:   09/13/18 0754  TempSrc:   PainSc: 0-No pain                 Durenda Hurt

## 2018-09-15 ENCOUNTER — Encounter: Payer: Self-pay | Admitting: Surgery

## 2018-09-15 ENCOUNTER — Ambulatory Visit (INDEPENDENT_AMBULATORY_CARE_PROVIDER_SITE_OTHER): Payer: Medicare Other | Admitting: Surgery

## 2018-09-15 ENCOUNTER — Other Ambulatory Visit: Payer: Self-pay

## 2018-09-15 VITALS — BP 139/80 | HR 101 | Temp 97.3°F | Ht 72.0 in | Wt 257.0 lb

## 2018-09-15 DIAGNOSIS — K3532 Acute appendicitis with perforation and localized peritonitis, without abscess: Secondary | ICD-10-CM

## 2018-09-15 NOTE — Progress Notes (Signed)
09/15/2018  History of Present Illness: [name] CroakWilliam O Bibby Jr. is a 48 y.o. male presenting for follow-up from hospitalization for ruptured appendicitis.  He was in the hospital from April 27 through May 2 he had follow-up appointment via virtual visit on 5/15.  He had a colonoscopy done on 7/14 after having a CT scan on 7/13 which show significant improvement of the rupture appendicitis and phlegmon as well as fluid collection noted previous to that.  Colonoscopy was unremarkable and now presents for scheduling surgery for his appendectomy.  Patient reports that since his hospital visit he has not had any worsening pain, nausea, vomiting, fevers, or chills.  He is tolerating a diet without complications with normal bowel function.  Past Medical History: Past Medical History:  Diagnosis Date  . Acne vulgaris   . Anxiety   . Depression   . Diabetes mellitus without complication (HCC)   . GERD (gastroesophageal reflux disease)   . Hyperlipidemia   . Hypertension   . OSA (obstructive sleep apnea)      Past Surgical History: Past Surgical History:  Procedure Laterality Date  . COLONOSCOPY WITH PROPOFOL N/A 09/12/2018   Procedure: COLONOSCOPY WITH PROPOFOL;  Surgeon: Toney ReilVanga, Rohini Reddy, MD;  [facility] END[facility]COPY;  Service: Gastroenterology;  Laterality: N/A;  . Gum Graft      Home Medications: Prior to Admission medications   Medication Sig Start Date End Date Taking? Authorizing Provider  albuterol (PROVENTIL HFA;VENTOLIN HFA) 108 (90 Base) MCG/ACT inhaler Inhale 2 puffs into the lungs every 6 (six) hours as needed for wheezing or shortness of breath.   Yes [provider]  atorvastatin (LIPITOR) 80 MG tablet Take 80 mg by mouth at bedtime.    Yes [provider]  Brexpiprazole (REXULTI) 2 MG TABS Take 2 mg by mouth daily.    Yes [provider]  cetirizine (ZYRTEC) 10 MG tablet Take 10 mg by mouth at bedtime.    Yes [provider]  escitalopram  (LEXAPRO) 10 MG tablet Take 10 mg by mouth daily.   Yes [provider]  feeding supplement, ENSURE ENLIVE, (ENSURE ENLIVE) LIQD Take 237 mLs by mouth 3 (three) times daily between meals. 08/07/17  Yes Mody, [name], MD  glimepiride (AMARYL) 4 MG tablet Take 4 mg by mouth daily with breakfast.   Yes [provider]  losartan (COZAAR) 50 MG tablet Take 50 mg by mouth at bedtime.    Yes [provider]  magnesium oxide (MAG-OX) 400 (241.3 Mg) MG tablet Take 1 tablet (400 mg total) by mouth daily. 08/08/17  Yes Mody, [name], MD  metoprolol succinate (TOPROL-XL) 50 MG 24 hr tablet Take 50 mg by mouth daily. Take with or immediately following a meal.   Yes [provider]  mupirocin ointment (BACTROBAN) 2 % Apply 1 application topically 2 (two) times daily. 05/01/18  Yes [provider]  NIFEdipine (PROCARDIA-XL/ADALAT CC) 30 MG 24 hr tablet Take 30 mg by mouth at bedtime.    Yes [provider]  omeprazole (PRILOSEC) 40 MG capsule Take 40 mg by mouth daily.   Yes [provider]  ondansetron (ZOFRAN) 4 MG tablet Take 1 tablet (4 mg total) by mouth every 8 (eight) hours as needed for nausea or vomiting. 07/01/18  Yes Ariany Kesselman, [name], MD  ondansetron (ZOFRAN) 4 MG tablet Take 1 tablet (4 mg total) by mouth every 6 (six) hours as needed for up to 7 doses for nausea. 09/11/18  Yes Vanga, Loel Dubonnetohini Reddy, MD  oxyCODONE (OXY IR/ROXICODONE) 5 MG immediate release tablet Take 1 tablet (5 mg total) by mouth every 6 (six) hours as needed for severe pain. 07/01/18  Yes Mack Alvidrez, [name], MD  sitaGLIPtin-metformin (JANUMET) 50-500 MG tablet Take 1 tablet by mouth 2 (two) times daily with a meal.   Yes [provider]  tadalafil (ADCIRCA/CIALIS) 20 MG tablet Take 20 mg by mouth as needed. 09/29/17  Yes [provider]  Vitamin D, Ergocalciferol, (DRISDOL) 50000 units CAPS capsule Take 1 capsule (50,000 Units total) by mouth every 7 (seven) days. 08/12/17  Yes  Mody, [name], MD  calcium-vitamin D (OSCAL 500/200 D-3) 500-200 MG-UNIT tablet Take 1 tablet by mouth 2 (two) times daily. 08/07/17 08/07/18  [name] SaranMody, [name], MD  potassium chloride 20 MEQ TBCR Take 40 mEq by mouth daily for 4 days. 08/07/17 08/11/17  [name] SaranMody, [name], MD    Allergies: Allergies  Allergen Reactions  . Penicillins Anaphylaxis    Happened as a child. Has patient had a PCN reaction causing immediate rash, facial/tongue/throat swelling, SOB or lightheadedness with hypotension: Yes Has patient had a PCN reaction causing severe rash involving mucus membranes or skin necrosis: No Has patient had a PCN reaction that required hospitalization: Unknown Has patient had a PCN reaction occurring within the last 10 years: No If all of the above answers are "NO", then may proceed with Cephalosporin use.  . Sulfa Antibiotics Anaphylaxis    Other reaction(s): Unknown  . Sulfasalazine Anaphylaxis  . Azithromycin Nausea And Vomiting and Rash    Review of Systems: Review of Systems  Constitutional: Negative for chills and fever.  Respiratory: Negative for shortness of breath.   Cardiovascular: Negative for chest pain.  Gastrointestinal: Negative for abdominal pain, nausea and vomiting.    Physical Exam BP 139/80   Pulse (!) 101   Temp (!) 97.3 F (36.3 C) (Skin)   Ht 6' (1.829 m)   Wt 257 lb (116.6 kg)   SpO2 98%   BMI 34.86 kg/m  CONSTITUTIONAL: No acute distress HEENT:  Normocephalic, atraumatic, extraocular motion intact. RESPIRATORY:  Lungs are clear, and breath sounds are equal bilaterally. Normal respiratory effort without pathologic use of accessory muscles. CARDIOVASCULAR: Heart is regular without murmurs, gallops, or rubs. GI: The abdomen is soft, obese, nondistended, nontender to palpation. There were no palpable masses. NEUROLOGIC:  Motor and sensation is grossly normal.  Cranial nerves are grossly intact. PSYCH:  Alert and oriented to person, place and time. Affect is  normal.  Labs/Imaging: Colonoscopy 09/12/2018: - Preparation of the colon was fair. - Perianal skin tags found on perianal exam. - The entire examined colon is normal. - Non-bleeding external hemorrhoids. - No specimens collected.  Assessment and Plan: This is a 48 y.o. male with a history of rupture appendicitis.  - Discussed with the patient that her current plan would be to schedule him for a laparoscopic appendectomy.  Discussed with him the surgery at length including risks of bleeding, infection, injury to surrounding structures.  Also discussed with him that if there is still to see much inflammation we may have to resect more than just the appendix which could include part of the cecum versus doing a right colectomy.  At this point his CAT scan appears significantly improved with no significant inflammation at the appendix or in the cecum so my hope would be that we can only do an appendectomy but the patient was informed of the possibility of further resection if need be.  This would be an OR  time decision.  He is willing to proceed will be scheduled for 09/25/2018.  He understands that he will undergo COVID testing before that.  Postop restrictions and outcomes were also discussed with the patient and he is understanding of these.  Face-to-face time spent with the patient and care providers was 25 minutes, with more than 50% of the time spent counseling, educating, and coordinating care of the patient.     Melvyn Neth, [facility]

## 2018-09-15 NOTE — H&P (View-Only) (Signed)
09/15/2018  History of Present Illness: [name] O Keinath Jr. is a 48 y.o. male presenting for follow-up from hospitalization for ruptured appendicitis.  He was in the hospital from April 27 through May 2 he had follow-up appointment via virtual visit on 5/15.  He had a colonoscopy done on 7/14 after having a CT scan on 7/13 which show significant improvement of the rupture appendicitis and phlegmon as well as fluid collection noted previous to that.  Colonoscopy was unremarkable and now presents for scheduling surgery for his appendectomy.  Patient reports that since his hospital visit he has not had any worsening pain, nausea, vomiting, fevers, or chills.  He is tolerating a diet without complications with normal bowel function.  Past Medical History: Past Medical History:  Diagnosis Date  . Acne vulgaris   . Anxiety   . Depression   . Diabetes mellitus without complication (HCC)   . GERD (gastroesophageal reflux disease)   . Hyperlipidemia   . Hypertension   . OSA (obstructive sleep apnea)      Past Surgical History: Past Surgical History:  Procedure Laterality Date  . COLONOSCOPY WITH PROPOFOL N/A 09/12/2018   Procedure: COLONOSCOPY WITH PROPOFOL;  Surgeon: Vanga, Rohini Reddy, MD;  Location: [facility] ENDOSCOPY;  Service: Gastroenterology;  Laterality: N/A;  . Gum Graft      Home Medications: Prior to Admission medications   Medication Sig Start Date End Date Taking? Authorizing Provider  albuterol (PROVENTIL HFA;VENTOLIN HFA) 108 (90 Base) MCG/ACT inhaler Inhale 2 puffs into the lungs every 6 (six) hours as needed for wheezing or shortness of breath.   Yes [provider]  atorvastatin (LIPITOR) 80 MG tablet Take 80 mg by mouth at bedtime.    Yes [provider]  Brexpiprazole (REXULTI) 2 MG TABS Take 2 mg by mouth daily.    Yes [provider]  cetirizine (ZYRTEC) 10 MG tablet Take 10 mg by mouth at bedtime.    Yes [provider]  escitalopram  (LEXAPRO) 10 MG tablet Take 10 mg by mouth daily.   Yes [provider]  feeding supplement, ENSURE ENLIVE, (ENSURE ENLIVE) LIQD Take 237 mLs by mouth 3 (three) times daily between meals. 08/07/17  Yes Mody, [name], MD  glimepiride (AMARYL) 4 MG tablet Take 4 mg by mouth daily with breakfast.   Yes [provider]  losartan (COZAAR) 50 MG tablet Take 50 mg by mouth at bedtime.    Yes [provider]  magnesium oxide (MAG-OX) 400 (241.3 Mg) MG tablet Take 1 tablet (400 mg total) by mouth daily. 08/08/17  Yes Mody, [name], MD  metoprolol succinate (TOPROL-XL) 50 MG 24 hr tablet Take 50 mg by mouth daily. Take with or immediately following a meal.   Yes [provider]  mupirocin ointment (BACTROBAN) 2 % Apply 1 application topically 2 (two) times daily. 05/01/18  Yes [provider]  NIFEdipine (PROCARDIA-XL/ADALAT CC) 30 MG 24 hr tablet Take 30 mg by mouth at bedtime.    Yes [provider]  omeprazole (PRILOSEC) 40 MG capsule Take 40 mg by mouth daily.   Yes [provider]  ondansetron (ZOFRAN) 4 MG tablet Take 1 tablet (4 mg total) by mouth every 8 (eight) hours as needed for nausea or vomiting. 07/01/18  Yes [name] Hermans, MD  ondansetron (ZOFRAN) 4 MG tablet Take 1 tablet (4 mg total) by mouth every 6 (six) hours as needed for up to 7 doses for nausea. 09/11/18  Yes Vanga, Rohini Reddy, MD    oxyCODONE (OXY IR/ROXICODONE) 5 MG immediate release tablet Take 1 tablet (5 mg total) by mouth every 6 (six) hours as needed for severe pain. 07/01/18  Yes [name] [name], [name] QuickJose, MD  sitaGLIPtin-metformin (JANUMET) 50-500 MG tablet Take 1 tablet by mouth 2 (two) times daily with a meal.   Yes [provider]  tadalafil (ADCIRCA/CIALIS) 20 MG tablet Take 20 mg by mouth as needed. 09/29/17  Yes [provider]  Vitamin D, Ergocalciferol, (DRISDOL) 50000 units CAPS capsule Take 1 capsule (50,000 Units total) by mouth every 7 (seven) days. 08/12/17  Yes  Mody, [name] [name], MD  calcium-vitamin D (OSCAL 500/200 D-3) 500-200 MG-UNIT tablet Take 1 tablet by mouth 2 (two) times daily. 08/07/17 08/07/18  [name] [name], [name], MD  potassium chloride 20 MEQ TBCR Take 40 mEq by mouth daily for 4 days. 08/07/17 08/11/17  [name] [name], [name], MD    Allergies: Allergies  Allergen Reactions  . Penicillins Anaphylaxis    Happened as a child. Has patient had a PCN reaction causing immediate rash, facial/tongue/throat swelling, SOB or lightheadedness with hypotension: Yes Has patient had a PCN reaction causing severe rash involving mucus membranes or skin necrosis: No Has patient had a PCN reaction that required hospitalization: Unknown Has patient had a PCN reaction occurring within the last 10 years: No If all of the above answers are "NO", then may proceed with Cephalosporin use.  . Sulfa Antibiotics Anaphylaxis    Other reaction(s): Unknown  . Sulfasalazine Anaphylaxis  . Azithromycin Nausea And Vomiting and Rash    Review of Systems: Review of Systems  Constitutional: Negative for chills and fever.  Respiratory: Negative for shortness of breath.   Cardiovascular: Negative for chest pain.  Gastrointestinal: Negative for abdominal pain, nausea and vomiting.    Physical Exam BP 139/80   Pulse (!) 101   Temp (!) 97.3 F (36.3 C) (Skin)   Ht 6' (1.829 m)   Wt 257 lb (116.6 kg)   SpO2 98%   BMI 34.86 kg/m  CONSTITUTIONAL: No acute distress HEENT:  Normocephalic, atraumatic, extraocular motion intact. RESPIRATORY:  Lungs are clear, and breath sounds are equal bilaterally. Normal respiratory effort without pathologic use of accessory muscles. CARDIOVASCULAR: Heart is regular without murmurs, gallops, or rubs. GI: The abdomen is soft, obese, nondistended, nontender to palpation. There were no palpable masses. NEUROLOGIC:  Motor and sensation is grossly normal.  Cranial nerves are grossly intact. PSYCH:  Alert and oriented to person, place and time. Affect is  normal.  Labs/Imaging: Colonoscopy 09/12/2018: - Preparation of the colon was fair. - Perianal skin tags found on perianal exam. - The entire examined colon is normal. - Non-bleeding external hemorrhoids. - No specimens collected.  Assessment and Plan: This is a 48 y.o. male with a history of rupture appendicitis.  - Discussed with the patient that her current plan would be to schedule him for a laparoscopic appendectomy.  Discussed with him the surgery at length including risks of bleeding, infection, injury to surrounding structures.  Also discussed with him that if there is still to see much inflammation we may have to resect more than just the appendix which could include part of the cecum versus doing a right colectomy.  At this point his CAT scan appears significantly improved with no significant inflammation at the appendix or in the cecum so my hope would be that we can only do an appendectomy but the patient was informed of the possibility of further resection if need be.  This would be an OR  time decision.  He is willing to proceed will be scheduled for 09/25/2018.  He understands that he will undergo COVID testing before that.  Postop restrictions and outcomes were also discussed with the patient and he is understanding of these.  Face-to-face time spent with the patient and care providers was 25 minutes, with more than 50% of the time spent counseling, educating, and coordinating care of the patient.     Melvyn Neth, [facility]

## 2018-09-18 ENCOUNTER — Telehealth: Payer: Self-pay

## 2018-09-18 NOTE — Telephone Encounter (Signed)
Call to patient to discuss surgery.  Patient's surgery to be scheduled for 09/25/18 at [facility] with Dr. Hampton Abbot.  The patient is aware to call on 09/22/18 between 1-3 pm to get his arrival time for surgery.  The patient is aware to have COVID-19 testing done on 09/20/18 at the [facility] drive thru ([address] [place]) between 8:00 am and 10:30 am. He is aware to isolate after, have no visitors, wash hands frequently, and avoid touching face.   The patient is aware he will be contacted by the [facility] to complete a phone interview sometime in the near future.  Patient aware to be NPO after midnight and have a driver.   He is aware to check in at the [facility] entrance where he/she will be screened for the coronavirus and then sent to Same Day Surgery.   Patient aware that he may have no visitors and driver will need to wait in the car due to COVID-19 restrictions.   The patient verbalizes understanding of the above.   The patient is aware to call the office should he have further questions.    Request for Medical Clearance has been faxed to his PCP, Dr Fulton Reek at [facility].

## 2018-09-19 ENCOUNTER — Encounter: Payer: Self-pay | Admitting: *Deleted

## 2018-09-19 NOTE — Progress Notes (Signed)
Medical clearance received from Dr. Fulton Reek office. Patient was clearaed at medium risk and the patient is optimized for surgery per medical clearance request.   A copy of medical clearance has been faxed to the Pre-admission Testing Department today.

## 2018-09-20 ENCOUNTER — Encounter
Admission: RE | Admit: 2018-09-20 | Discharge: 2018-09-20 | Disposition: A | Discharge status: Home / Self Care | Payer: Medicare Other | Source: Ambulatory Visit | Attending: Surgery | Admitting: Surgery

## 2018-09-20 ENCOUNTER — Other Ambulatory Visit: Payer: Self-pay

## 2018-09-20 DIAGNOSIS — Z1159 Encounter for screening for other viral diseases: Secondary | ICD-10-CM | POA: Diagnosis not present

## 2018-09-20 DIAGNOSIS — I10 Essential (primary) hypertension: Secondary | ICD-10-CM | POA: Insufficient documentation

## 2018-09-20 DIAGNOSIS — Z01818 Encounter for other preprocedural examination: Secondary | ICD-10-CM | POA: Insufficient documentation

## 2018-09-20 DIAGNOSIS — E119 Type 2 diabetes mellitus without complications: Secondary | ICD-10-CM | POA: Diagnosis not present

## 2018-09-20 LAB — SARS CORONAVIRUS 2 (TAT 6-24 HRS): SARS Coronavirus 2: NEGATIVE

## 2018-09-20 NOTE — Patient Instructions (Signed)
Your procedure is scheduled on: Mon 09/25/18 Report to [facility] ON 2ND FLOOR [facility]ALL ENTRANCE. To find out your arrival time please call [phone] between 1PM - 3PM on Fri 09/22/18.  Remember: Instructions that are not followed completely may result in serious medical risk, up to and including death, or upon the discretion of your surgeon and anesthesiologist your surgery may need to be rescheduled.     _X__ 1. Do not eat food after midnight the night before your procedure.                 No gum chewing or hard candies. You may drink clear liquids up to 2 hours                 before you are scheduled to arrive for your surgery- DO not drink clear                 liquids within 2 hours of the start of your surgery.                 Clear Liquids include:  water, apple juice without pulp, clear carbohydrate                 drink such as Clearfast or Gatorade, Black Coffee or Tea (Do not add                 anything to coffee or tea). Diabetics water only  __X__2.  On the morning of surgery brush your teeth with toothpaste and water, you                 may rinse your mouth with mouthwash if you wish.  Do not swallow any              toothpaste of mouthwash.     _X__ 3.  No Alcohol for 24 hours before or after surgery.   _X__ 4.  Do Not Smoke or use e-cigarettes For 24 Hours Prior to Your Surgery.                 Do not use any chewable tobacco products for at least 6 hours prior to                 surgery.  ____  5.  Bring all medications with you on the day of surgery if instructed.   __X__  6.  Notify your doctor if there is any change in your medical condition      (cold, fever, infections).     Do not wear jewelry, make-up, hairpins, clips or nail polish. Do not wear lotions, powders, or perfumes.  Do not shave 48 hours prior to surgery. Men may shave face and neck. Do not bring valuables to the hospital.    [facility] is not responsible for any  belongings or valuables.  Contacts, dentures/partials or body piercings may not be worn into surgery. Bring a case for your contacts, glasses or hearing aids, a denture cup will be supplied. Leave your suitcase in the car. After surgery it may be brought to your room. For patients admitted to the hospital, discharge time is determined by your treatment team.   Patients discharged the day of surgery will not be allowed to drive home.   Please read over the following fact sheets that you were given:   MRSA Information  __X__ Take these medicines the morning of surgery with A SIP OF WATER:  1. citalopram (CELEXA  2. famotidine (PEPCID)  3. magnesium oxide (MAG-OX)   4. metoprolol succinate (TOPROL-XL  5. omeprazole (PRILOSEC  6.  ____ Fleet Enema (as directed)   __X__ Use CHG Soap/SAGE wipes as directed  __X__ Use inhalers on the day of surgery  __X__ Stop metformin/Janumet/Farxiga 2 days prior to surgery    ____ Take 1/2 of usual insulin dose the night before surgery. No insulin the morning          of surgery.   ____ Stop Blood Thinners Coumadin/Plavix/Xarelto/Pleta/Pradaxa/Eliquis/Effient/Aspirin  on   Or contact your Surgeon, Cardiologist or Medical Doctor regarding  ability to stop your blood thinners  __X__ Stop Anti-inflammatories 7 days before surgery such as Advil, Ibuprofen, Motrin,  BC or Goodies Powder, Naprosyn, Naproxen, Aleve, Aspirin    __X__ Stop all herbal supplements, fish oil or vitamin E until after surgery.    __X_ Bring C-Pap to the hospital.

## 2018-09-22 ENCOUNTER — Telehealth: Payer: Self-pay | Admitting: *Deleted

## 2018-09-22 NOTE — Telephone Encounter (Signed)
Left message for patient to call the office regarding his surgery that is scheduled for Monday 0727/20 with Dr.Piscoya.  Ask patient is it okay for Dr.Pabon to do the surgery since Dr.Piscoya is out of the office due to a family emergency.  If so please let Caryllyn know and also Dr.Pabon know about proceeding with surgery .

## 2018-09-22 NOTE — Telephone Encounter (Signed)
Patient agreed and pleased to have Dr.Pabon do the surgery on Monday 09/25/18

## 2018-09-24 MED ORDER — METRONIDAZOLE IN NACL 5-0.79 MG/ML-% IV SOLN
500.0000 mg | INTRAVENOUS | Status: AC
  Administered 2018-09-25: 500 mg via INTRAVENOUS
  Filled 2018-09-24: qty 100

## 2018-09-24 MED ORDER — CIPROFLOXACIN IN D5W 400 MG/200ML IV SOLN
400.0000 mg | INTRAVENOUS | Status: AC
  Administered 2018-09-25: 11:00:00 400 mg via INTRAVENOUS

## 2018-09-25 ENCOUNTER — Encounter: Payer: Self-pay | Admitting: *Deleted

## 2018-09-25 ENCOUNTER — Ambulatory Visit: Payer: Medicare Other | Admitting: Certified Registered"

## 2018-09-25 ENCOUNTER — Encounter
Admission: RE | Disposition: A | Discharge status: Home / Self Care | Payer: Self-pay | Source: Home / Self Care | Attending: Surgery

## 2018-09-25 ENCOUNTER — Inpatient Hospital Stay
Admission: RE | Admit: 2018-09-25 | Discharge: 2018-09-27 | DRG: 331 | Disposition: A | Discharge status: Home / Self Care | Payer: Medicare Other | Attending: Surgery | Admitting: Surgery

## 2018-09-25 DIAGNOSIS — E119 Type 2 diabetes mellitus without complications: Secondary | ICD-10-CM | POA: Diagnosis present

## 2018-09-25 DIAGNOSIS — F419 Anxiety disorder, unspecified: Secondary | ICD-10-CM | POA: Diagnosis present

## 2018-09-25 DIAGNOSIS — Z7984 Long term (current) use of oral hypoglycemic drugs: Secondary | ICD-10-CM

## 2018-09-25 DIAGNOSIS — K429 Umbilical hernia without obstruction or gangrene: Secondary | ICD-10-CM | POA: Diagnosis present

## 2018-09-25 DIAGNOSIS — K3532 Acute appendicitis with perforation and localized peritonitis, without abscess: Secondary | ICD-10-CM | POA: Diagnosis present

## 2018-09-25 DIAGNOSIS — K3533 Acute appendicitis with perforation and localized peritonitis, with abscess: Secondary | ICD-10-CM | POA: Diagnosis present

## 2018-09-25 DIAGNOSIS — F329 Major depressive disorder, single episode, unspecified: Secondary | ICD-10-CM | POA: Diagnosis present

## 2018-09-25 DIAGNOSIS — G4733 Obstructive sleep apnea (adult) (pediatric): Secondary | ICD-10-CM | POA: Diagnosis present

## 2018-09-25 DIAGNOSIS — I10 Essential (primary) hypertension: Secondary | ICD-10-CM | POA: Diagnosis present

## 2018-09-25 DIAGNOSIS — Z5331 Laparoscopic surgical procedure converted to open procedure: Secondary | ICD-10-CM | POA: Diagnosis not present

## 2018-09-25 DIAGNOSIS — K660 Peritoneal adhesions (postprocedural) (postinfection): Secondary | ICD-10-CM | POA: Diagnosis present

## 2018-09-25 DIAGNOSIS — E785 Hyperlipidemia, unspecified: Secondary | ICD-10-CM | POA: Diagnosis present

## 2018-09-25 DIAGNOSIS — Z9049 Acquired absence of other specified parts of digestive tract: Secondary | ICD-10-CM

## 2018-09-25 DIAGNOSIS — Z882 Allergy status to sulfonamides status: Secondary | ICD-10-CM | POA: Diagnosis not present

## 2018-09-25 DIAGNOSIS — Z880 Allergy status to penicillin: Secondary | ICD-10-CM | POA: Diagnosis not present

## 2018-09-25 DIAGNOSIS — Z79899 Other long term (current) drug therapy: Secondary | ICD-10-CM

## 2018-09-25 DIAGNOSIS — K219 Gastro-esophageal reflux disease without esophagitis: Secondary | ICD-10-CM | POA: Diagnosis present

## 2018-09-25 HISTORY — PX: LAPAROSCOPIC RIGHT COLECTOMY: SHX5925

## 2018-09-25 HISTORY — PX: LAPAROSCOPIC APPENDECTOMY: SHX408

## 2018-09-25 LAB — CBC
HCT: 34.6 % — ABNORMAL LOW (ref 39.0–52.0)
Hemoglobin: 11.1 g/dL — ABNORMAL LOW (ref 13.0–17.0)
MCH: 27.6 pg (ref 26.0–34.0)
MCHC: 32.1 g/dL (ref 30.0–36.0)
MCV: 86.1 fL (ref 80.0–100.0)
Platelets: 159 10*3/uL (ref 150–400)
RBC: 4.02 MIL/uL — ABNORMAL LOW (ref 4.22–5.81)
RDW: 14.1 % (ref 11.5–15.5)
WBC: 17.4 10*3/uL — ABNORMAL HIGH (ref 4.0–10.5)
nRBC: 0 % (ref 0.0–0.2)

## 2018-09-25 LAB — CREATININE, SERUM
Creatinine, Ser: 1.06 mg/dL (ref 0.61–1.24)
GFR calc Af Amer: >60 mL/min (ref >60)
GFR calc non Af Amer: >60 mL/min (ref >60)

## 2018-09-25 LAB — GLUCOSE, CAPILLARY
Glucose-Capillary: 238 mg/dL — ABNORMAL HIGH (ref 70–99)
Glucose-Capillary: 261 mg/dL — ABNORMAL HIGH (ref 70–99)
Glucose-Capillary: 253 mg/dL — ABNORMAL HIGH (ref 70–99)
Glucose-Capillary: 168 mg/dL — ABNORMAL HIGH (ref 70–99)

## 2018-09-25 SURGERY — APPENDECTOMY, LAPAROSCOPIC
Anesthesia: General | Laterality: Right

## 2018-09-25 MED ORDER — PROPOFOL 10 MG/ML IV BOLUS
INTRAVENOUS | Status: AC
  Filled 2018-09-25: qty 20

## 2018-09-25 MED ORDER — CITALOPRAM HYDROBROMIDE 20 MG PO TABS
10.0000 mg | ORAL_TABLET | Freq: Every day | ORAL | Status: DC
  Administered 2018-09-25 – 2018-09-27 (×3): 10 mg via ORAL
  Filled 2018-09-25 (×3): qty 1

## 2018-09-25 MED ORDER — ONDANSETRON 4 MG PO TBDP: 4.0000 mg | ORAL_TABLET | Freq: Four times a day (QID) | ORAL | Status: DC | PRN

## 2018-09-25 MED ORDER — SODIUM CHLORIDE (PF) 0.9 % IJ SOLN
INTRAMUSCULAR | Status: AC
  Filled 2018-09-25: qty 50

## 2018-09-25 MED ORDER — ALBUTEROL SULFATE HFA 108 (90 BASE) MCG/ACT IN AERS: 2.0000 | INHALATION_SPRAY | Freq: Four times a day (QID) | RESPIRATORY_TRACT | Status: DC | PRN

## 2018-09-25 MED ORDER — ONDANSETRON HCL 4 MG/2ML IJ SOLN: 4.0000 mg | Freq: Once | INTRAMUSCULAR | Status: DC | PRN

## 2018-09-25 MED ORDER — ROCURONIUM BROMIDE 100 MG/10ML IV SOLN
INTRAVENOUS | Status: AC
  Filled 2018-09-25: qty 1

## 2018-09-25 MED ORDER — ONDANSETRON HCL 4 MG/2ML IJ SOLN
INTRAMUSCULAR | Status: DC | PRN
  Administered 2018-09-25: 4 mg via INTRAVENOUS

## 2018-09-25 MED ORDER — SUGAMMADEX SODIUM 500 MG/5ML IV SOLN
INTRAVENOUS | Status: AC
  Filled 2018-09-25: qty 5

## 2018-09-25 MED ORDER — ACETAMINOPHEN 500 MG PO TABS
1000.0000 mg | ORAL_TABLET | ORAL | Status: AC
  Administered 2018-09-25: 10:00:00 1000 mg via ORAL

## 2018-09-25 MED ORDER — SODIUM CHLORIDE 0.9 % IV SOLN
INTRAVENOUS | Status: DC
  Administered 2018-09-25 (×2): via INTRAVENOUS

## 2018-09-25 MED ORDER — GABAPENTIN 300 MG PO CAPS
300.0000 mg | ORAL_CAPSULE | ORAL | Status: AC
  Administered 2018-09-25: 10:00:00 300 mg via ORAL

## 2018-09-25 MED ORDER — KETOROLAC TROMETHAMINE 30 MG/ML IJ SOLN
30.0000 mg | Freq: Four times a day (QID) | INTRAMUSCULAR | Status: DC | PRN
  Administered 2018-09-25: 30 mg via INTRAVENOUS
  Filled 2018-09-25: qty 1

## 2018-09-25 MED ORDER — GLYCOPYRROLATE 0.2 MG/ML IJ SOLN
INTRAMUSCULAR | Status: AC
  Filled 2018-09-25: qty 1

## 2018-09-25 MED ORDER — GABAPENTIN 300 MG PO CAPS
ORAL_CAPSULE | ORAL | Status: AC
  Administered 2018-09-25: 10:00:00 300 mg via ORAL
  Filled 2018-09-25: qty 1

## 2018-09-25 MED ORDER — CHLORHEXIDINE GLUCONATE CLOTH 2 % EX PADS
6.0000 | MEDICATED_PAD | Freq: Once | CUTANEOUS | Status: AC
  Administered 2018-09-25: 10:00:00 6 via TOPICAL

## 2018-09-25 MED ORDER — BUPIVACAINE LIPOSOME 1.3 % IJ SUSP
INTRAMUSCULAR | Status: AC
  Filled 2018-09-25: qty 20

## 2018-09-25 MED ORDER — LIDOCAINE HCL (PF) 2 % IJ SOLN
INTRAMUSCULAR | Status: AC
  Filled 2018-09-25: qty 10

## 2018-09-25 MED ORDER — FENTANYL CITRATE (PF) 100 MCG/2ML IJ SOLN: 25.0000 ug | INTRAMUSCULAR | Status: DC | PRN

## 2018-09-25 MED ORDER — DEXMEDETOMIDINE HCL IN NACL 200 MCG/50ML IV SOLN
INTRAVENOUS | Status: DC | PRN
  Administered 2018-09-25: 8 ug via INTRAVENOUS

## 2018-09-25 MED ORDER — DEXAMETHASONE SODIUM PHOSPHATE 10 MG/ML IJ SOLN
INTRAMUSCULAR | Status: AC
  Filled 2018-09-25: qty 1

## 2018-09-25 MED ORDER — ENOXAPARIN SODIUM 40 MG/0.4ML ~~LOC~~ SOLN
40.0000 mg | SUBCUTANEOUS | Status: DC
  Administered 2018-09-26 – 2018-09-27 (×2): 40 mg via SUBCUTANEOUS
  Filled 2018-09-25 (×2): qty 0.4

## 2018-09-25 MED ORDER — OXYCODONE HCL 5 MG PO TABS
5.0000 mg | ORAL_TABLET | ORAL | Status: DC | PRN
  Administered 2018-09-26 (×3): 10 mg via ORAL
  Filled 2018-09-25 (×3): qty 2

## 2018-09-25 MED ORDER — BUPIVACAINE-EPINEPHRINE 0.25% -1:200000 IJ SOLN
INTRAMUSCULAR | Status: DC | PRN
  Administered 2018-09-25: 30 mL

## 2018-09-25 MED ORDER — SODIUM CHLORIDE 0.9 % IV SOLN
INTRAVENOUS | Status: DC
  Administered 2018-09-25 – 2018-09-26 (×3): via INTRAVENOUS

## 2018-09-25 MED ORDER — INSULIN ASPART 100 UNIT/ML ~~LOC~~ SOLN
0.0000 [IU] | Freq: Three times a day (TID) | SUBCUTANEOUS | Status: DC
  Administered 2018-09-25: 18:00:00 11 [IU] via SUBCUTANEOUS
  Administered 2018-09-26: 12:00:00 3 [IU] via SUBCUTANEOUS
  Administered 2018-09-26: 08:00:00 4 [IU] via SUBCUTANEOUS
  Administered 2018-09-27: 08:00:00 7 [IU] via SUBCUTANEOUS
  Filled 2018-09-25 (×4): qty 1

## 2018-09-25 MED ORDER — ACETAMINOPHEN 500 MG PO TABS
ORAL_TABLET | ORAL | Status: AC
  Administered 2018-09-25: 10:00:00 1000 mg via ORAL
  Filled 2018-09-25: qty 2

## 2018-09-25 MED ORDER — BUPIVACAINE-EPINEPHRINE (PF) 0.25% -1:200000 IJ SOLN
INTRAMUSCULAR | Status: AC
  Filled 2018-09-25: qty 30

## 2018-09-25 MED ORDER — SITAGLIPTIN PHOS-METFORMIN HCL 50-500 MG PO TABS: 1.0000 | ORAL_TABLET | Freq: Two times a day (BID) | ORAL | Status: DC

## 2018-09-25 MED ORDER — PROPOFOL 10 MG/ML IV BOLUS
INTRAVENOUS | Status: DC | PRN
  Administered 2018-09-25: 200 mg via INTRAVENOUS

## 2018-09-25 MED ORDER — SUCCINYLCHOLINE CHLORIDE 20 MG/ML IJ SOLN
INTRAMUSCULAR | Status: DC | PRN
  Administered 2018-09-25: 180 mg via INTRAVENOUS

## 2018-09-25 MED ORDER — CHLORHEXIDINE GLUCONATE CLOTH 2 % EX PADS
6.0000 | MEDICATED_PAD | Freq: Once | CUTANEOUS | Status: DC
  Administered 2018-09-25: 6 via TOPICAL

## 2018-09-25 MED ORDER — ESMOLOL HCL 100 MG/10ML IV SOLN
INTRAVENOUS | Status: DC | PRN
  Administered 2018-09-25: 20 mg via INTRAVENOUS

## 2018-09-25 MED ORDER — HYDRALAZINE HCL 20 MG/ML IJ SOLN: 10.0000 mg | INTRAMUSCULAR | Status: DC | PRN

## 2018-09-25 MED ORDER — LIDOCAINE HCL (CARDIAC) PF 100 MG/5ML IV SOSY
PREFILLED_SYRINGE | INTRAVENOUS | Status: DC | PRN
  Administered 2018-09-25: 100 mg via INTRAVENOUS

## 2018-09-25 MED ORDER — INSULIN ASPART 100 UNIT/ML ~~LOC~~ SOLN
6.0000 [IU] | Freq: Three times a day (TID) | SUBCUTANEOUS | Status: DC
  Administered 2018-09-25 – 2018-09-27 (×4): 6 [IU] via SUBCUTANEOUS
  Filled 2018-09-25 (×5): qty 1

## 2018-09-25 MED ORDER — MIDAZOLAM HCL 2 MG/2ML IJ SOLN
INTRAMUSCULAR | Status: AC
  Filled 2018-09-25: qty 2

## 2018-09-25 MED ORDER — NIFEDIPINE ER OSMOTIC RELEASE 30 MG PO TB24
30.0000 mg | ORAL_TABLET | Freq: Every day | ORAL | Status: DC
  Administered 2018-09-25 – 2018-09-26 (×2): 30 mg via ORAL
  Filled 2018-09-25 (×3): qty 1

## 2018-09-25 MED ORDER — BUPIVACAINE LIPOSOME 1.3 % IJ SUSP
INTRAMUSCULAR | Status: DC | PRN
  Administered 2018-09-25: 20 mL

## 2018-09-25 MED ORDER — SODIUM CHLORIDE (PF) 0.9 % IJ SOLN
INTRAMUSCULAR | Status: DC | PRN
  Administered 2018-09-25: 50 mL

## 2018-09-25 MED ORDER — MIDAZOLAM HCL 2 MG/2ML IJ SOLN
INTRAMUSCULAR | Status: DC | PRN
  Administered 2018-09-25: 2 mg via INTRAVENOUS

## 2018-09-25 MED ORDER — PROCHLORPERAZINE EDISYLATE 10 MG/2ML IJ SOLN
5.0000 mg | Freq: Four times a day (QID) | INTRAMUSCULAR | Status: DC | PRN
  Filled 2018-09-25: qty 2

## 2018-09-25 MED ORDER — ONDANSETRON HCL 4 MG/2ML IJ SOLN
INTRAMUSCULAR | Status: AC
  Filled 2018-09-25: qty 2

## 2018-09-25 MED ORDER — METOPROLOL SUCCINATE ER 50 MG PO TB24
50.0000 mg | ORAL_TABLET | Freq: Every day | ORAL | Status: DC
  Administered 2018-09-26 – 2018-09-27 (×2): 50 mg via ORAL
  Filled 2018-09-25 (×3): qty 1

## 2018-09-25 MED ORDER — FENTANYL CITRATE (PF) 100 MCG/2ML IJ SOLN
INTRAMUSCULAR | Status: AC
  Filled 2018-09-25: qty 2

## 2018-09-25 MED ORDER — FENTANYL CITRATE (PF) 100 MCG/2ML IJ SOLN
INTRAMUSCULAR | Status: DC | PRN
  Administered 2018-09-25 (×2): 50 ug via INTRAVENOUS

## 2018-09-25 MED ORDER — ALBUTEROL SULFATE (2.5 MG/3ML) 0.083% IN NEBU: 2.5000 mg | INHALATION_SOLUTION | Freq: Four times a day (QID) | RESPIRATORY_TRACT | Status: DC | PRN

## 2018-09-25 MED ORDER — ROCURONIUM BROMIDE 100 MG/10ML IV SOLN
INTRAVENOUS | Status: DC | PRN
  Administered 2018-09-25: 5 mg via INTRAVENOUS
  Administered 2018-09-25 (×2): 20 mg via INTRAVENOUS
  Administered 2018-09-25: 10 mg via INTRAVENOUS
  Administered 2018-09-25: 45 mg via INTRAVENOUS

## 2018-09-25 MED ORDER — SUCCINYLCHOLINE CHLORIDE 20 MG/ML IJ SOLN
INTRAMUSCULAR | Status: AC
  Filled 2018-09-25: qty 1

## 2018-09-25 MED ORDER — QUETIAPINE FUMARATE 25 MG PO TABS
50.0000 mg | ORAL_TABLET | Freq: Every day | ORAL | Status: DC
  Administered 2018-09-25 – 2018-09-26 (×2): 50 mg via ORAL
  Filled 2018-09-25 (×2): qty 2

## 2018-09-25 MED ORDER — CIPROFLOXACIN IN D5W 400 MG/200ML IV SOLN
INTRAVENOUS | Status: AC
  Filled 2018-09-25: qty 200

## 2018-09-25 MED ORDER — GLIMEPIRIDE 4 MG PO TABS
4.0000 mg | ORAL_TABLET | Freq: Every day | ORAL | Status: DC
  Administered 2018-09-26 – 2018-09-27 (×2): 4 mg via ORAL
  Filled 2018-09-25 (×2): qty 1

## 2018-09-25 MED ORDER — LORATADINE 10 MG PO TABS
10.0000 mg | ORAL_TABLET | Freq: Every day | ORAL | Status: DC
  Administered 2018-09-25 – 2018-09-27 (×3): 10 mg via ORAL
  Filled 2018-09-25 (×3): qty 1

## 2018-09-25 MED ORDER — SUGAMMADEX SODIUM 500 MG/5ML IV SOLN
INTRAVENOUS | Status: DC | PRN
  Administered 2018-09-25: 460 mg via INTRAVENOUS

## 2018-09-25 MED ORDER — DEXAMETHASONE SODIUM PHOSPHATE 10 MG/ML IJ SOLN
INTRAMUSCULAR | Status: DC | PRN
  Administered 2018-09-25: 5 mg via INTRAVENOUS

## 2018-09-25 MED ORDER — GLYCOPYRROLATE 0.2 MG/ML IJ SOLN
INTRAMUSCULAR | Status: DC | PRN
  Administered 2018-09-25: .2 mg via INTRAVENOUS

## 2018-09-25 MED ORDER — ACETAMINOPHEN 500 MG PO TABS
1000.0000 mg | ORAL_TABLET | Freq: Four times a day (QID) | ORAL | Status: DC
  Administered 2018-09-25 – 2018-09-27 (×6): 1000 mg via ORAL
  Filled 2018-09-25 (×7): qty 2

## 2018-09-25 MED ORDER — METFORMIN HCL 500 MG PO TABS
500.0000 mg | ORAL_TABLET | Freq: Two times a day (BID) | ORAL | Status: DC
  Administered 2018-09-25 – 2018-09-27 (×4): 500 mg via ORAL
  Filled 2018-09-25 (×4): qty 1

## 2018-09-25 MED ORDER — INSULIN ASPART 100 UNIT/ML ~~LOC~~ SOLN: 0.0000 [IU] | Freq: Every day | SUBCUTANEOUS | Status: DC

## 2018-09-25 MED ORDER — ONDANSETRON HCL 4 MG/2ML IJ SOLN: 4.0000 mg | Freq: Four times a day (QID) | INTRAMUSCULAR | Status: DC | PRN

## 2018-09-25 MED ORDER — MORPHINE SULFATE (PF) 2 MG/ML IV SOLN: 2.0000 mg | INTRAVENOUS | Status: DC | PRN

## 2018-09-25 MED ORDER — PANTOPRAZOLE SODIUM 40 MG IV SOLR
40.0000 mg | Freq: Every day | INTRAVENOUS | Status: DC
  Administered 2018-09-25 – 2018-09-26 (×2): 40 mg via INTRAVENOUS
  Filled 2018-09-25 (×2): qty 40

## 2018-09-25 MED ORDER — PROCHLORPERAZINE MALEATE 10 MG PO TABS
10.0000 mg | ORAL_TABLET | Freq: Four times a day (QID) | ORAL | Status: DC | PRN
  Filled 2018-09-25: qty 1

## 2018-09-25 SURGICAL SUPPLY — 55 items
APPLIER CLIP 5 13 M/L LIGAMAX5 (MISCELLANEOUS) ×4
BLADE CLIPPER SURG (BLADE) ×4 IMPLANT
CANISTER SUCT 1200ML W/VALVE (MISCELLANEOUS) ×4 IMPLANT
CHLORAPREP W/TINT 26 (MISCELLANEOUS) ×4 IMPLANT
CLIP APPLIE 5 13 M/L LIGAMAX5 (MISCELLANEOUS) ×2 IMPLANT
COVER WAND RF STERILE (DRAPES) ×4 IMPLANT
CUTTER FLEX LINEAR 45M (STAPLE) ×4 IMPLANT
DERMABOND ADVANCED (GAUZE/BANDAGES/DRESSINGS) ×4
DERMABOND ADVANCED .7 DNX12 (GAUZE/BANDAGES/DRESSINGS) ×2 IMPLANT
DRAPE INCISE IOBAN 66X45 STRL (DRAPES) ×2 IMPLANT
ELECT CAUTERY BLADE 6.4 (BLADE) ×4 IMPLANT
ELECT REM PT RETURN 9FT ADLT (ELECTROSURGICAL) ×4
ELECTRODE REM PT RTRN 9FT ADLT (ELECTROSURGICAL) ×2 IMPLANT
GLOVE BIO SURGEON STRL SZ7 (GLOVE) ×4 IMPLANT
GOWN STRL REUS W/ TWL LRG LVL3 (GOWN DISPOSABLE) ×4 IMPLANT
GOWN STRL REUS W/TWL LRG LVL3 (GOWN DISPOSABLE) ×4
HANDLE YANKAUER SUCT BULB TIP (MISCELLANEOUS) ×2 IMPLANT
IRRIGATION STRYKERFLOW (MISCELLANEOUS) ×2 IMPLANT
IRRIGATOR STRYKERFLOW (MISCELLANEOUS) ×4
IV NS 1000ML (IV SOLUTION) ×2
IV NS 1000ML BAXH (IV SOLUTION) ×2 IMPLANT
NEEDLE HYPO 22GX1.5 SAFETY (NEEDLE) ×6 IMPLANT
NS IRRIG 500ML POUR BTL (IV SOLUTION) ×4 IMPLANT
PACK LAP CHOLECYSTECTOMY (MISCELLANEOUS) ×4 IMPLANT
PENCIL ELECTRO HAND CTR (MISCELLANEOUS) ×6 IMPLANT
POUCH SPECIMEN RETRIEVAL 10MM (ENDOMECHANICALS) ×4 IMPLANT
RELOAD 45 VASCULAR/THIN (ENDOMECHANICALS) ×4 IMPLANT
RELOAD PROXIMATE 75MM BLUE (ENDOMECHANICALS) ×12 IMPLANT
RELOAD STAPLE 45 2.5 WHT GRN (ENDOMECHANICALS) ×2 IMPLANT
RELOAD STAPLE 45 3.5 BLU ETS (ENDOMECHANICALS) ×2 IMPLANT
RELOAD STAPLE 75 3.8 BLU REG (ENDOMECHANICALS) IMPLANT
RELOAD STAPLE TA45 3.5 REG BLU (ENDOMECHANICALS) ×8 IMPLANT
SCISSORS METZENBAUM CVD 33 (INSTRUMENTS) IMPLANT
SHEARS HARMONIC ACE PLUS 36CM (ENDOMECHANICALS) ×4 IMPLANT
SLEEVE ENDOPATH XCEL 5M (ENDOMECHANICALS) ×4 IMPLANT
SPONGE DRAIN TRACH 4X4 STRL 2S (GAUZE/BANDAGES/DRESSINGS) ×2 IMPLANT
SPONGE LAP 18X18 RF (DISPOSABLE) ×4 IMPLANT
STAPLER PROXIMATE 75MM BLUE (STAPLE) ×2 IMPLANT
STAPLER SKIN PROX 35W (STAPLE) ×2 IMPLANT
SUT MNCRL 4-0 (SUTURE) ×2
SUT MNCRL 4-0 27XMFL (SUTURE) ×2
SUT MNCRL AB 4-0 PS2 18 (SUTURE) ×4 IMPLANT
SUT PDS AB 0 CT1 27 (SUTURE) ×2 IMPLANT
SUT SILK 2 0 (SUTURE) ×2
SUT SILK 2 0SH CR/8 30 (SUTURE) ×2 IMPLANT
SUT SILK 2-0 18XBRD TIE 12 (SUTURE) IMPLANT
SUT VICRYL 0 AB UR-6 (SUTURE) ×8 IMPLANT
SUTURE MNCRL 4-0 27XMF (SUTURE) IMPLANT
SYR 20ML LL LF (SYRINGE) ×4 IMPLANT
SYS LAPSCP GELPORT 120MM (MISCELLANEOUS) ×4
SYSTEM LAPSCP GELPORT 120MM (MISCELLANEOUS) IMPLANT
TRAY FOLEY MTR SLVR 16FR STAT (SET/KITS/TRAYS/PACK) ×4 IMPLANT
TROCAR XCEL BLUNT TIP 100MML (ENDOMECHANICALS) ×4 IMPLANT
TROCAR XCEL NON-BLD 5MMX100MML (ENDOMECHANICALS) ×8 IMPLANT
TUBING EVAC SMOKE HEATED PNEUM (TUBING) ×4 IMPLANT

## 2018-09-25 NOTE — Anesthesia Preprocedure Evaluation (Addendum)
Anesthesia Evaluation  Patient identified by MRN, date of birth, ID band Patient awake    Reviewed: Allergy & Precautions, NPO status , Patient's Chart, lab work & pertinent test results, reviewed documented beta blocker date and time   Airway Mallampati: III  TM Distance: <3 FB     Dental  (+) Chipped, Caps, Poor Dentition   Pulmonary sleep apnea ,    Pulmonary exam normal        Cardiovascular hypertension, Pt. on medications and Pt. on home beta blockers Normal cardiovascular exam     Neuro/Psych PSYCHIATRIC DISORDERS Anxiety Depression negative neurological ROS     GI/Hepatic Neg liver ROS, GERD  Medicated,  Endo/Other  diabetes  Renal/GU negative Renal ROS  negative genitourinary   Musculoskeletal negative musculoskeletal ROS (+)   Abdominal Normal abdominal exam  (+)   Peds negative pediatric ROS (+)  Hematology negative hematology ROS (+)   Anesthesia Other Findings   Reproductive/Obstetrics                            Anesthesia Physical  Anesthesia Plan  ASA: III  Anesthesia Plan: General   Post-op Pain Management:    Induction: Intravenous  PONV Risk Score and Plan:   Airway Management Planned: Oral ETT  Additional Equipment:   Intra-op Plan:   Post-operative Plan: Extubation in OR  Informed Consent: I have reviewed the patients History and Physical, chart, labs and discussed the procedure including the risks, benefits and alternatives for the proposed anesthesia with the patient or authorized representative who has indicated his/her understanding and acceptance.     Dental advisory given  Plan Discussed with: CRNA and Surgeon  Anesthesia Plan Comments:         Anesthesia Quick Evaluation

## 2018-09-25 NOTE — Op Note (Addendum)
PROCEDURES: 1. Attempted Laparoscopic Appendectomy 2. Hand assisted Laparoscopic Right Hemicolectomy With stapled ileocolostomy 3. Repair of umbilical hernia  Pre-operative Diagnosis: Appendicitis  Post-operative Diagnosis: Same  Surgeon: Marjory Lies Mishka Stegemann   Assistants: Dr. Peyton Najjar  Required due to the complexity of the case: for exposure and creation of the anastomosis  Anesthesia: General endotracheal anesthesia  ASA Class: 2  Surgeon: Caroleen Hamman , MD FACS  Anesthesia: Gen. with endotracheal tube   Findings: Right cecum with  Phlegmon  And severe inflammatory response around the base of the appendix and cecum making it unsafe to perform an appendectomy due to cecum being involve and inability to fire a stapler within cecum.  Reducible umbilical hernia  Estimated Blood Loss: 100cc         Drains: 15 FR RLQ         Specimens: Right colon          Complications: none          Procedure Details  The patient was seen again in the Holding Room. The benefits, complications, treatment options, and expected outcomes were discussed with the patient. The risks of bleeding, infection, recurrence of symptoms, failure to resolve symptoms,  bowel injury, any of which could require further surgery were reviewed with the patient.   The patient was taken to Operating Room, identified and the procedure verified.  A Time Out was held and the above information confirmed.  Prior to the induction of general anesthesia, antibiotic prophylaxis was administered. VTE prophylaxis was in place. General endotracheal anesthesia was then administered and tolerated well. After the induction, the abdomen was prepped with Chloraprep and draped in the sterile fashion. The patient was positioned in the supine position.  Infraumbilical incision was created and the hernia was identified sac was excised.  2 stay sutures were placed on the fascia and a Hassan trocar was inserted.  2 5 mm trochars were placed under  direct visualization.  There was visualization of the cecum but there was significant inflammatory response within the cecum and appendix.  I was unable to tell what it was appendix and cecum.  It was fused.  There was also some adhesions from the sigmoid to the cecum.  These were taken down with a fracturing technique using the suction device.  Because of the severe phlegmon I decided to convert my case to a hand-assisted case.   7 cm incision was created as a midline mini laparotomy. The abdominal cavity was entered under direct visualization and the GelPort device was placed. two 5 mm ports were placed  under direct visualization and pneumoperitoneum was obtained.  No Hemodynamic changes were observed. The greater omentum was divided and the hepatic flexure was taken down using harmonic scalpel.  The white line of Toldt was incised and a lateral to medial dissection was performed.  We identified the right ureter as well as the duodenum and preserve both structures at all times. We Were also able to mobilize the attachments of the cecum and terminal ileum.  Once we had an adequate mobilization were able to remove the GelPort and exteriorized the right colon.  A 5 cm margin on the terminal ileum was identified and we created a window with electrocautery and divided the terminal ileum.  Attention then was turned to the distal excision margin.  We selected a good viable place within the colon to divide or distal margin. We  Were able to also use a 75 GIA stapler to divide this area.  The  mesentery was scored with electrocautery.  We identified the right colic artery and suture ligated with 2 0 silks in the standard fashion.  The rest of the mesentery was divided using the harmonic scalpel.   Specimen was passed and sent to permanent pathology.  A standard side-to-side functional end to end staple anastomosis was created with multiple loads of a 75 GIA stapler device.  We check for patency as well as leak.   There was a tension-free anastomosis with good perfusion and no evidence of intraoperative leak.   A 15 FR Drain was placed RLQ and secured to the abd wall with  a2-0 silk. We changed gloves and place a clean closure tray.   Liposomal  Marcaine was injected throughout the abdominal wall on both sides under direct visualization and palpation.  The fascia was closed with a running 0 PDS using the small bite technique. The hernia defect was incorporated  Within our repair and the umbilicus was excised to avoid any wound issues given frail vascular supply  Incisions were closed with 4-0 Monocryl in a subcuticular fashion.  Dermabond was used to coat the skin.  Needle and laparotomy counts were correct and there were no immediate complications   Sterling Bigiego Jahquez Steffler, MD, FACS

## 2018-09-25 NOTE — Transfer of Care (Signed)
Immediate Anesthesia Transfer of Care Note  Patient: [name] [name].  Procedure(s) Performed: APPENDECTOMY LAPAROSCOPIC (N/A ) LAPAROSCOPIC RIGHT COLECTOMY (Right )  Patient Location: PACU  Anesthesia Type:General  Level of Consciousness: awake, alert , patient cooperative and responds to stimulation  Airway & Oxygen Therapy: Patient Spontanous Breathing and Patient connected to face mask oxygen  Post-op Assessment: Report given to RN and Post -op Vital signs reviewed and stable  Post vital signs: Reviewed and stable  Last Vitals:  Vitals Value Taken Time  BP 125/74 09/25/18 1356  Temp    Pulse 90 09/25/18 1400  Resp 23 09/25/18 1400  SpO2 98 % 09/25/18 1400  Vitals shown include unvalidated device data.  Last Pain:  Vitals:   09/25/18 0955  TempSrc: Temporal  PainSc: 0-No pain         Complications: No apparent anesthesia complications

## 2018-09-25 NOTE — Progress Notes (Signed)
Dr. Lysle Pearl notified of patient's request for CPAP at bedtime for sleep apnea; "I have to wear it at night, or I stop breathing".  Acknowledged; new order written. Barbaraann Faster, RN 11:40 PM 09/25/2018

## 2018-09-25 NOTE — Anesthesia Procedure Notes (Signed)
Procedure Name: Intubation Performed by: Kelton Pillar, CRNA Pre-anesthesia Checklist: Patient identified, Emergency Drugs available, Suction available and Patient being monitored Patient Re-evaluated:Patient Re-evaluated prior to induction Oxygen Delivery Method: Circle system utilized Preoxygenation: Pre-oxygenation with 100% oxygen Induction Type: IV induction Ventilation: Two handed mask ventilation required, Mask ventilation with difficulty and Oral airway inserted - appropriate to patient size Laryngoscope Size: McGraph and 4 Grade View: Grade I Tube type: Oral Tube size: 7.5 mm Number of attempts: 1 Airway Equipment and Method: Stylet Placement Confirmation: ETT inserted through vocal cords under direct vision,  positive ETCO2,  CO2 detector and breath sounds checked- equal and bilateral Secured at: 21 cm Tube secured with: Tape Dental Injury: Teeth and Oropharynx as per pre-operative assessment

## 2018-09-25 NOTE — Anesthesia Postprocedure Evaluation (Signed)
Anesthesia Post Note  Patient: [name] [name].  Procedure(s) Performed: APPENDECTOMY LAPAROSCOPIC (N/A ) LAPAROSCOPIC RIGHT COLECTOMY (Right )  Patient location during evaluation: PACU Anesthesia Type: General Level of consciousness: awake and alert Pain management: pain level controlled Vital Signs Assessment: post-procedure vital signs reviewed and stable Respiratory status: spontaneous breathing and respiratory function stable Cardiovascular status: stable Anesthetic complications: no     Last Vitals:  Vitals:   09/25/18 1441 09/25/18 1504  BP: 119/76 115/66  Pulse: 84 74  Resp: (!) 24 16  Temp: 36.4 C (!) 36.4 C  SpO2: 92% 95%    Last Pain:  Vitals:   09/25/18 1555  TempSrc:   PainSc: 0-No pain                 Kamyah Wilhelmsen,Ankit K

## 2018-09-25 NOTE — Interval H&P Note (Signed)
History and Physical Interval Note:  09/25/2018 10:38 AM  [name] [name].  has presented today for surgery, with the diagnosis of ruptured appendicitis.  The various methods of treatment have been discussed with the patient and family. After consideration of risks, benefits and other options for treatment, the patient has consented to  Procedure(s): APPENDECTOMY LAPAROSCOPIC (N/A) as a surgical intervention.  The patient's history has been reviewed, patient examined, no change in status, stable for surgery.  I have reviewed the patient's chart and labs.  Questions were answered to the patient's satisfaction.   Since Dr. Hampton Abbot is out for family emergency I will cover this case  Diego F Pabon

## 2018-09-25 NOTE — Anesthesia Post-op Follow-up Note (Signed)
Anesthesia QCDR form completed.        

## 2018-09-26 ENCOUNTER — Other Ambulatory Visit: Payer: Self-pay

## 2018-09-26 LAB — GLUCOSE, CAPILLARY
Glucose-Capillary: 155 mg/dL — ABNORMAL HIGH (ref 70–99)
Glucose-Capillary: 137 mg/dL — ABNORMAL HIGH (ref 70–99)
Glucose-Capillary: 93 mg/dL (ref 70–99)
Glucose-Capillary: 119 mg/dL — ABNORMAL HIGH (ref 70–99)

## 2018-09-26 LAB — COMPREHENSIVE METABOLIC PANEL
ALT: 27 U/L (ref 0–44)
AST: 25 U/L (ref 15–41)
Albumin: 3.2 g/dL — ABNORMAL LOW (ref 3.5–5.0)
Alkaline Phosphatase: 100 U/L (ref 38–126)
Anion gap: 9 (ref 5–15)
BUN: 19 mg/dL (ref 6–20)
CO2: 27 mmol/L (ref 22–32)
Calcium: 7.9 mg/dL — ABNORMAL LOW (ref 8.9–10.3)
Chloride: 102 mmol/L (ref 98–111)
Creatinine, Ser: 1.15 mg/dL (ref 0.61–1.24)
GFR calc Af Amer: >60 mL/min (ref >60)
GFR calc non Af Amer: >60 mL/min (ref >60)
Glucose, Bld: 168 mg/dL — ABNORMAL HIGH (ref 70–99)
Potassium: 4.2 mmol/L (ref 3.5–5.1)
Sodium: 138 mmol/L (ref 135–145)
Total Bilirubin: 0.6 mg/dL (ref 0.3–1.2)
Total Protein: 7 g/dL (ref 6.5–8.1)

## 2018-09-26 LAB — HIV ANTIBODY (ROUTINE TESTING W REFLEX): HIV Screen 4th Generation wRfx: NONREACTIVE

## 2018-09-26 MED ORDER — CHLORHEXIDINE GLUCONATE 0.12 % MT SOLN
15.0000 mL | Freq: Two times a day (BID) | OROMUCOSAL | Status: DC
  Administered 2018-09-26 – 2018-09-27 (×2): 15 mL via OROMUCOSAL
  Filled 2018-09-26 (×3): qty 15

## 2018-09-26 MED ORDER — ORAL CARE MOUTH RINSE
15.0000 mL | Freq: Two times a day (BID) | OROMUCOSAL | Status: DC
  Administered 2018-09-26 (×2): 15 mL via OROMUCOSAL

## 2018-09-26 MED ORDER — DOCUSATE SODIUM 100 MG PO CAPS
100.0000 mg | ORAL_CAPSULE | Freq: Every day | ORAL | Status: DC | PRN
  Administered 2018-09-26: 100 mg via ORAL
  Filled 2018-09-26: qty 1

## 2018-09-26 MED ORDER — DOCUSATE SODIUM 100 MG PO CAPS
100.0000 mg | ORAL_CAPSULE | Freq: Once | ORAL | Status: AC
  Administered 2018-09-26: 100 mg via ORAL
  Filled 2018-09-26: qty 1

## 2018-09-26 NOTE — Progress Notes (Signed)
Trainer Hospital Day(s): 1.   Post op day(s): 1 Day Post-Op.   Interval History: Patient seen and examined, no acute events or new complaints overnight. Patient reports that he is doing well this morning. No complaints of fever, chills, nausea, or emesis. He has tolerated advancement of his diet without issue. Endorses passing some flatus. Has not mobilized outside his room yet. JP with about 120 ccs out since surgery. No other issues or complaints.    Vital signs in last 24 hours: [min-max] current  Temp:  [97.3 F (36.3 C)-98.9 F (37.2 C)] 98.4 F (36.9 C) (07/28 0407) Pulse Rate:  [73-102] 73 (07/28 0407) Resp:  [16-25] 19 (07/28 0407) BP: (115-142)/(66-85) 116/74 (07/28 0407) SpO2:  [92 %-100 %] 97 % (07/28 0407) Weight:  [116.7 kg] 116.7 kg (07/28 0521)     Height: 6\' 1"  (185.4 cm) Weight: 116.7 kg BMI (Calculated): 33.95   Intake/Output last 2 shifts:  07/27 0701 - 07/28 0700 In: 2613.5 [P.O.:240; I.V.:2373.5] Out: 750 [Urine:600; Drains:120; Blood:30]   Physical Exam:  Constitutional: alert, cooperative and no distress  Respiratory: breathing non-labored at rest  Gastrointestinal: soft, non-tender, and non-distended. JP in suprapubic region with serosanguinous output Integumentary: Laparotomy and laparoscopic incisions are CDI with dermabond, no erythema or drainage.   Labs:  CBC Latest Ref Rng & Units 09/25/2018 07/01/2018 06/30/2018  WBC 4.0 - 10.5 K/uL 17.4(H) 9.3 9.9  Hemoglobin 13.0 - 17.0 g/dL 11.1(L) 10.2(L) 9.9(L)  Hematocrit 39.0 - 52.0 % 34.6(L) 32.6(L) 30.7(L)  Platelets 150 - 400 K/uL 159 228 219   CMP Latest Ref Rng & Units 09/26/2018 09/25/2018 06/29/2018  Glucose 70 - 99 mg/dL 168(H) - 162(H)  BUN 6 - 20 mg/dL 19 - 11  Creatinine 0.61 - 1.24 mg/dL 1.15 1.06 1.08  Sodium 135 - 145 mmol/L 138 - 142  Potassium 3.5 - 5.1 mmol/L 4.2 - 3.5  Chloride 98 - 111 mmol/L 102 - 103  CO2 22 - 32 mmol/L 27 - 28  Calcium 8.9 -  10.3 mg/dL 7.9(L) - 7.8(L)  Total Protein 6.5 - 8.1 g/dL 7.0 - -  Total Bilirubin 0.3 - 1.2 mg/dL 0.6 - -  Alkaline Phos 38 - 126 U/L 100 - -  AST 15 - 41 U/L 25 - -  ALT 0 - 44 U/L 27 - -     Imaging studies: No new pertinent imaging studies   Assessment/Plan: 48 y.o. male overall doing very well  1 Day Post-Op s/p attempted laparoscopic appendectomy converted to open with right hemicolectomy for perforated appendicitis with significant inflammatory response.   - Continue diet as tolerated  - Discontinue IVF  - pain control prn; antiemetics prn  - monitor abdominal examination; on-going bowel function  - monitor JP output   - mobilization encouraged   - medical management of comorbidities   - DVT prophylaxis   - Discharge planning: Hopefully home tomorrow morning   All of the above findings and recommendations were discussed with the patient, patient's family (Wife via telephone), and the medical team, and all of patient's and family's questions were answered to their expressed satisfaction.  -- Edison Simon, PA-C [facility] 09/26/2018, 7:30 AM [phone] M-F: 7am - 4pm

## 2018-09-26 NOTE — Progress Notes (Signed)
Per PA okay for RN to DC IVF order. Pt is currently drinking enough fluids.

## 2018-09-26 NOTE — Progress Notes (Signed)
Pt seen and examined w Mr. Olean Ree [facility]. Doing well, taking PO, No N/V AVSS, labs ok. Plan for DC in am

## 2018-09-27 LAB — GLUCOSE, CAPILLARY
Glucose-Capillary: 201 mg/dL — ABNORMAL HIGH (ref 70–99)
Glucose-Capillary: 116 mg/dL — ABNORMAL HIGH (ref 70–99)

## 2018-09-27 MED ORDER — OXYCODONE-ACETAMINOPHEN 5-325 MG PO TABS: 1.0000 | ORAL_TABLET | ORAL | Status: DC | PRN

## 2018-09-27 MED ORDER — POLYETHYLENE GLYCOL 3350 17 G PO PACK
17.0000 g | PACK | Freq: Every day | ORAL | Status: DC
  Administered 2018-09-27: 12:00:00 17 g via ORAL
  Filled 2018-09-27: qty 1

## 2018-09-27 MED ORDER — OXYCODONE-ACETAMINOPHEN 5-325 MG PO TABS: 1.0000 | ORAL_TABLET | Freq: Four times a day (QID) | ORAL | 0 refills | Status: DC | PRN

## 2018-09-27 NOTE — Progress Notes (Signed)
Discharge instructions reviewed with patient and wife including followup visits, JP drain care and new medications.  Understanding was verbalized and all questions were answered.  IV removed without complication; patient tolerated well.  Patient discharged home via wheelchair in stable condition escorted by nursing staff.

## 2018-09-27 NOTE — Discharge Summary (Signed)
[name] SUR[facility]ICAL DIS[facility]GE SUMMARY   Patient ID: [name] [name]. MRN: [id number] DOB/AGE: [birth year] 48 y.o.  Admit date: 09/25/2018 Discharge date: 09/27/2018  Discharge Diagnoses Patient Active Problem List   Diagnosis Date Noted  . S/P laparoscopic colectomy 09/25/2018  . History of appendicitis   . Acute appendicitis 06/26/2018    Consultants None  Procedures 09/25/2018:  1. Attempted Laparoscopic Appendectomy 2. Hand assisted Laparoscopic Right Hemicolectomy With stapled ileocolostomy 3. Repair of umbilical hernia   HPI: [name] [name]. is a 48 y.o. male with a history of perforated appendicitis who presents to [name] for laparoscopic appendectomy with Dr [name] [name] on 09/25/2018.  Hospital Course: Informed consent was obtained and documented, and patient underwent attempted laparoscopic appendectomy which was ultimately converted to open right hemicolectomy secondary to phlegmon and significant inflammatory response around the appendix (Dr [name] [name], 09/25/2018).  Post-operatively, patient's pain improved/resolved and advancement of patient's diet and ambulation were well-tolerated. The remainder of patient's hospital course was essentially unremarkable, and discharge planning was initiated accordingly with patient safely able to be discharged home with appropriate discharge instructions, drain teaching, pain control, and outpatient follow-up after all of his and his family's questions (wife via telephone) were answered to their expressed satisfaction.   Discharge Condition: Good   Physical Examination:  Constitutional: Well appearing male, NAD Pulmonary: Normal effort, no respiratory distress Gastrointestinal: Soft, incisional soreness, non-distended, no rebound/guarding, JP in suprapubic region with serosanguinous drainage  Skin: Laparotomy and laparoscopic incisions are CDI with dermabond, no erythema or drainage   Allergies as of 09/27/2018     Reactions   Penicillins Anaphylaxis, Other (See Comments)   Happened as a child. Has patient had a PCN reaction causing immediate rash, facial/tongue/throat swelling, SOB or lightheadedness with hypotension: Yes Has patient had a PCN reaction causing severe rash involving mucus membranes or skin necrosis: No Has patient had a PCN reaction that required hospitalization: Unknown Has patient had a PCN reaction occurring within the last 10 years: No If all of the above answers are "NO", then may proceed with Cephalosporin use.   Sulfa Antibiotics Anaphylaxis   Sulfasalazine Anaphylaxis   Azithromycin Nausea And Vomiting, Rash      Medication List    TAKE these medications   albuterol 108 (90 Base) MCG/ACT inhaler Commonly known as: VENTOLIN HFA Inhale 2 puffs into the lungs every 6 (six) hours as needed for wheezing or shortness of breath.   atorvastatin 80 MG tablet Commonly known as: LIPITOR Take 80 mg by mouth at bedtime.   calcium-vitamin D 500-200 MG-UNIT tablet Commonly known as: Oscal 500/200 D-3 Take 1 tablet by mouth 2 (two) times daily. What changed: when to take this   cetirizine 10 MG tablet Commonly known as: ZYRTEC Take 10 mg by mouth at bedtime.   citalopram 10 MG tablet Commonly known as: CELEXA Take 10 mg by mouth daily.   famotidine 20 MG tablet Commonly known as: PEPCID Take 20 mg by mouth daily.   glimepiride 4 MG tablet Commonly known as: AMARYL Take 4 mg by mouth daily with breakfast.   losartan 50 MG tablet Commonly known as: COZAAR Take 50 mg by mouth at bedtime.   magnesium oxide 400 (241.3 Mg) MG tablet Commonly known as: MAG-OX Take 1 tablet (400 mg total) by mouth daily.   metoprolol succinate 50 MG 24 hr tablet Commonly known as: TOPROL-XL Take 50 mg by mouth daily. Take with or immediately following a meal.   NIFEdipine 30 MG 24  hr tablet Commonly known as: ADALAT CC Take 30 mg by mouth at bedtime.   nystatin powder Generic  drug: nystatin Apply 1 g topically 2 (two) times a day.   omeprazole 40 MG capsule Commonly known as: PRILOSEC Take 40 mg by mouth daily.   oxyCODONE-acetaminophen 5-325 MG tablet Commonly known as: PERCOCET/ROXICET Take 1 tablet by mouth every 6 (six) hours as needed for severe pain.   QUEtiapine 50 MG tablet Commonly known as: SEROQUEL Take 50 mg by mouth at bedtime.   sitaGLIPtin-metformin 50-500 MG tablet Commonly known as: JANUMET Take 1 tablet by mouth 2 (two) times daily with a meal.   Vitamin D (Ergocalciferol) 1.25 MG (50000 UT) Caps capsule Commonly known as: DRISDOL Take 1 capsule (50,000 Units total) by mouth every 7 (seven) days. What changed: when to take this        Follow-up Information    Pabon, [place]Die[place] F, MD. Schedule an appointment as soon as possible for a visit on 10/02/2018.   Specialty: General Surgery Why: s/p right hemicolectomy for perforated appendicitis, has drain...[facility] Contact information: [address] [place] [address] [phone]            Time spent on discharge management including discussion of hospital course, clinical condition, outpatient instructions, prescriptions, and follow up with the patient and members of the medical team: >30 minutes  -- Lynden OxfordZachary [name] , PA-C [facility]  09/27/2018, 9:11 AM [phone] M-F: 7am - 4pm

## 2018-09-29 ENCOUNTER — Telehealth: Payer: Self-pay | Admitting: *Deleted

## 2018-09-29 LAB — SURGICAL PATHOLOGY

## 2018-09-29 NOTE — Telephone Encounter (Signed)
Patient concerned due to color of bowel movements. Patient instructed to call if bloody tarry stools. He also asked if he could ride around in a car and was told yes but to get out and walk around very hour to not ride for long hours at a time.

## 2018-09-29 NOTE — Telephone Encounter (Signed)
Patient called stated that his bowel movements have been very dark brown, he wanted to know if this is normal. Please call and advise

## 2018-10-02 ENCOUNTER — Ambulatory Visit (INDEPENDENT_AMBULATORY_CARE_PROVIDER_SITE_OTHER): Payer: BC Managed Care – PPO | Admitting: Physician Assistant

## 2018-10-02 ENCOUNTER — Other Ambulatory Visit: Payer: Self-pay

## 2018-10-02 ENCOUNTER — Encounter: Payer: Self-pay | Admitting: Physician Assistant

## 2018-10-02 ENCOUNTER — Encounter: Payer: BC Managed Care – PPO | Admitting: Physician Assistant

## 2018-10-02 VITALS — BP 117/75 | HR 102 | Temp 97.9°F | Ht 73.0 in | Wt 254.0 lb

## 2018-10-02 DIAGNOSIS — Z09 Encounter for follow-up examination after completed treatment for conditions other than malignant neoplasm: Secondary | ICD-10-CM

## 2018-10-02 DIAGNOSIS — K3532 Acute appendicitis with perforation and localized peritonitis, without abscess: Secondary | ICD-10-CM

## 2018-10-02 NOTE — Patient Instructions (Addendum)
OK to shower. Keep area dry. May change dressing daily after shower with dry gauze and tape.   May drive once pain free with driving movements, also may not drive while on narcotic pain medication.  May remove drain dressing after 48 hours. May continue dressing as needed if any further draining.   Follow up in 2 weeks, no heavy lifting.

## 2018-10-02 NOTE — Progress Notes (Signed)
[facility] POST-OP OFFICE VISIT  10/02/2018  HPI: Huck Ashworth. is a 48 y.o. male 7 days s/p attempted laparoscopic appendectomy converted to right hemicolectomy secondary to significant inflammatory response.   He presents with his wife today. Overall he reports that he is doing well. Intermittent abdominal soreness. No fever, chills, nausea, or emesis. He did report two darker stools after discharge but those resolved. JP drain with serous output around 40-70 ccs a day. No other issues.   Vital signs: BP 117/75   Pulse (!) 102   Temp 97.9 F (36.6 C)   Ht 6\' 1"  (1.854 m)   Wt 254 lb (115.2 kg)   SpO2 95%   BMI 33.51 kg/m    Physical Exam: Constitutional: Well appearing male, NAD Abdomen: Soft, non-tender, non-distended, JP in suprapubic region with serous output, this was removed today Skin: Laparoscopic incisions are CDI with dermabond, his midline laparotomy incision is draining serosanguinous fluid with central scab, there is surrounding ecchymosis, no erythema   Assessment/Plan: This is a 48 y.o. male overall doing well 1 week s/p attempted laparoscopic appendectomy converted to right hemicolectomy secondary to significant inflammatory response   - JP removed   - dry dressing changes daily  - tylenol, motrin, ice for pain prn  - okay to shower  - complete 4 weeks minimum lifting restrictions  - pathology reviewed  - RTC in 2 weeks  -- Edison Simon, PA-C [facility] 10/02/2018, 10:20 AM [phone] M-F: 7am - 4pm

## 2018-10-19 ENCOUNTER — Telehealth: Payer: Self-pay | Admitting: Surgery

## 2018-10-19 NOTE — Telephone Encounter (Signed)
Telephone Triage Questions   Date of surgery? 09/25/2018 Physician?    Dr. Dahlia Byes  Patient is calling said he spoke to someone in the office and was told he could use hydrocortisone cream due to him having an itch, patient is asking about the cream, and also said he has pins that were starting to come off. Please call patient and advise.

## 2018-10-19 NOTE — Telephone Encounter (Signed)
Patient instructed that he can use Hydrocortisone cream over the counter to the itching area. He report that he has some of the dead skin is starting to come off. He reports no redness, increased pain, or drainage. He says the surgical area looks good and has no other concerns. He has a follow up appointment tomorrow.

## 2018-10-20 ENCOUNTER — Encounter: Payer: Self-pay | Admitting: Physician Assistant

## 2018-10-20 ENCOUNTER — Other Ambulatory Visit: Payer: Self-pay

## 2018-10-20 ENCOUNTER — Ambulatory Visit (INDEPENDENT_AMBULATORY_CARE_PROVIDER_SITE_OTHER): Payer: BC Managed Care – PPO | Admitting: Physician Assistant

## 2018-10-20 VITALS — BP 136/85 | HR 82 | Temp 97.5°F | Ht 73.0 in | Wt 253.0 lb

## 2018-10-20 DIAGNOSIS — K3532 Acute appendicitis with perforation and localized peritonitis, without abscess: Secondary | ICD-10-CM

## 2018-10-20 DIAGNOSIS — Z09 Encounter for follow-up examination after completed treatment for conditions other than malignant neoplasm: Secondary | ICD-10-CM

## 2018-10-20 NOTE — Patient Instructions (Signed)
Return as needed.The patient is aware to call back for any questions or concerns.  

## 2018-10-20 NOTE — Progress Notes (Signed)
[facility] POST-OP OFFICE VISIT  10/20/2018  HPI: [name] [name]. is a 48 y.o. male ~1 months s/p right hemicolectomy for perforated appendicitis with Dr Dahlia Byes. No acute issues. Incisions well healed. No pain, nausea, emesis.    Vital signs: BP 136/85   Pulse 82   Temp (!) 97.5 F (36.4 C)   Ht 6\' 1"  (1.854 m)   Wt 253 lb (114.8 kg)   SpO2 96%   BMI 33.38 kg/m    Physical Exam: Constitutional: Well appearing male, NAD  Abdomen: Soft, non-tender, non-distended Skin: Laparoscopic and laparotomy incisions are well healed  Assessment/Plan: This is a 48 y.o. male  ~1 months s/p right hemicolectomy,    - No issues  - call if needed  - rtc prn  -- Edison Simon, PA-C [facility] 10/20/2018, 9:07 AM [phone] M-F: 7am - 4pm

## 2018-12-18 ENCOUNTER — Telehealth: Payer: Self-pay | Admitting: *Deleted

## 2018-12-18 NOTE — Telephone Encounter (Signed)
Patient wants to know if he can start lifting weights again, he had surgery by Dr.Pabon on 09/25/18 Appendectomy, he stated that his phone is acting up and to leave a message if he doesn't answer.

## 2018-12-18 NOTE — Telephone Encounter (Signed)
Spoke with patient. He has a Orthoptist. He was instructed to gradually start lifting and each week progress to more. He is 13 weeks out from his surgery and wanted to check with our office prior to lifting weights.

## 2018-12-21 ENCOUNTER — Encounter: Payer: Self-pay | Admitting: Surgery

## 2019-09-18 DIAGNOSIS — K219 Gastro-esophageal reflux disease without esophagitis: Secondary | ICD-10-CM | POA: Insufficient documentation

## 2019-10-29 ENCOUNTER — Ambulatory Visit (INDEPENDENT_AMBULATORY_CARE_PROVIDER_SITE_OTHER): Payer: Medicare Other | Admitting: Internal Medicine

## 2019-10-29 DIAGNOSIS — Z9989 Dependence on other enabling machines and devices: Secondary | ICD-10-CM | POA: Insufficient documentation

## 2019-10-29 DIAGNOSIS — R0602 Shortness of breath: Secondary | ICD-10-CM

## 2019-10-29 DIAGNOSIS — G4733 Obstructive sleep apnea (adult) (pediatric): Secondary | ICD-10-CM

## 2019-10-29 NOTE — Patient Instructions (Signed)

## 2019-10-29 NOTE — Progress Notes (Signed)
[facility] [address] [place], [place] [address]  Pulmonary Sleep Medicine   Office Visit Note  Patient Name: [name] CroakWilliam O Keir [name]. DOB: Sep 29, 1970 MRN [id number]    Chief Complaint: Obstructive Sleep Apnea visit  Brief History:  Chrissie NoaWilliam is seen today for follow up The patient has a 8 year history of sleep apnea. Patient is using PAP nightly.  The patient feels more rest after sleeping with PAP.  The patient reports benefiting from PAP use.He does take a sleep aid due to his manic depression. He goes to bed between 8-9 p.m. and gets up at 6 a.m. to take his daughter to school. He will then get back in bed and sleep for a few more hours. Reported sleepiness is improved and the Epworth Sleepiness Score is 7out of 24. The patient does take naps. The patient complains of the following: occasional dry mouth The compliance download shows excellent compliance with an average use time of 12.8 hours. The AHI is 0.1  The patient does not of limb movements disrupting sleep.  ROS  General: (-) fever, (-) chills, (-) night sweat Nose and Sinuses: (-) nasal stuffiness or itchiness, (-) postnasal drip, (-) nosebleeds, (-) sinus trouble. Mouth and Throat: (-) sore throat, (-) hoarseness. Neck: (-) swollen glands, (-) enlarged thyroid, (-) neck pain. Respiratory: + cough, + shortness of breath, + wheezing. Neurologic: - numbness, - tingling. Psychiatric: - anxiety, - depression   Current Medication: Outpatient Encounter Medications as of 10/29/2019  Medication Sig   empagliflozin (JARDIANCE) 25 MG TABS tablet Take by mouth.   fenofibrate (TRICOR) 145 MG tablet Take 145 mg by mouth daily.   lamoTRIgine (LAMICTAL) 200 MG tablet Take 200 mg by mouth daily.   Metoprolol Tartrate 75 MG TABS Take 112.5 mg by mouth daily.   omeprazole (PRILOSEC) 40 MG capsule Take 40 mg by mouth daily.   potassium chloride SA (KLOR-CON) 20 MEQ tablet Take 20 mEq by mouth daily.   [DISCONTINUED]  omeprazole (PRILOSEC) 20 MG capsule Take 20 mg by mouth daily.   albuterol (PROVENTIL HFA;VENTOLIN HFA) 108 (90 Base) MCG/ACT inhaler Inhale 2 puffs into the lungs every 6 (six) hours as needed for wheezing or shortness of breath.   atorvastatin (LIPITOR) 80 MG tablet Take 80 mg by mouth at bedtime.    calcium-vitamin D (OSCAL 500/200 D-3) 500-200 MG-UNIT tablet Take 1 tablet by mouth 2 (two) times daily. (Patient taking differently: Take 1 tablet by mouth at bedtime. )   cetirizine (ZYRTEC) 10 MG tablet Take 10 mg by mouth at bedtime.    glimepiride (AMARYL) 4 MG tablet Take 4 mg by mouth daily with breakfast.   losartan (COZAAR) 50 MG tablet Take 50 mg by mouth at bedtime.    magnesium oxide (MAG-OX) 400 (241.3 Mg) MG tablet Take 1 tablet (400 mg total) by mouth daily.   NIFEdipine (PROCARDIA-XL/ADALAT CC) 30 MG 24 hr tablet Take 30 mg by mouth at bedtime.    nystatin (NYSTATIN) powder Apply 1 g topically 2 (two) times a day.   sitaGLIPtin-metformin (JANUMET) 50-500 MG tablet Take 1 tablet by mouth 2 (two) times daily with a meal.   Vitamin D, Ergocalciferol, (DRISDOL) 50000 units CAPS capsule Take 1 capsule (50,000 Units total) by mouth every 7 (seven) days. (Patient taking differently: Take 50,000 Units by mouth every Friday. )   [DISCONTINUED] citalopram (CELEXA) 10 MG tablet Take 10 mg by mouth daily.   [DISCONTINUED] famotidine (PEPCID) 20 MG tablet Take 20 mg by mouth  daily.   [DISCONTINUED] metoprolol succinate (TOPROL-XL) 50 MG 24 hr tablet Take 50 mg by mouth daily. Take with or immediately following a meal.   [DISCONTINUED] omeprazole (PRILOSEC) 40 MG capsule Take 40 mg by mouth daily.   [DISCONTINUED] oxyCODONE-acetaminophen (PERCOCET/ROXICET) 5-325 MG tablet Take 1 tablet by mouth every 6 (six) hours as needed for severe pain.   [DISCONTINUED] QUEtiapine (SEROQUEL) 50 MG tablet Take 50 mg by mouth at bedtime.   No facility-administered encounter medications on file  as of 10/29/2019.    Surgical History: Past Surgical History:  Procedure Laterality Date   COLONOSCOPY WITH PROPOFOL N/A 09/12/2018   Procedure: COLONOSCOPY WITH PROPOFOL;  Surgeon: Toney Reil, MD;  Location: [facility] ENDOSCOPY;  Service: Gastroenterology;  Laterality: N/A;   Gum Graft     LAPAROSCOPIC APPENDECTOMY N/A 09/25/2018   Procedure: APPENDECTOMY LAPAROSCOPIC;  Surgeon: Leafy Ro, MD;  Location: [facility] ORS;  Service: General;  Laterality: N/A;   LAPAROSCOPIC RIGHT COLECTOMY Right 09/25/2018   Procedure: LAPAROSCOPIC RIGHT COLECTOMY;  Surgeon: Leafy Ro, MD;  Location: [facility] ORS;  Service: General;  Laterality: Right;    Medical History: Past Medical History:  Diagnosis Date   Acne vulgaris    Anxiety    Depression    Diabetes mellitus without complication (HCC)    GERD (gastroesophageal reflux disease)    Hyperlipidemia    Hypertension    OSA (obstructive sleep apnea)     Family History: Non contributory to the present illness  Social History: Social History   Socioeconomic History   Marital status: Married    Spouse name: Not on file   Number of children: Not on file   Years of education: Not on file   Highest education level: Not on file  Occupational History   Not on file  Tobacco Use   Smoking status: Never Smoker   Smokeless tobacco: Never Used  Vaping Use   Vaping Use: Never used  Substance and Sexual Activity   Alcohol use: No   Drug use: No   Sexual activity: Not on file  Other Topics Concern   Not on file  Social History Narrative   Lives at home with family   Ambulates independently   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   Social Determinants of Health   Financial Resource Strain:    Difficulty of Paying Living Expenses: Not on file  Food Insecurity:    Worried About Programme researcher, broadcasting/film/video in the Last Year: Not on file   [employer] of Food in the Last Year: Not on file  Transportation Needs:    Lack of Transportation  (Medical): Not on file   Lack of Transportation (Non-Medical): Not on file  Physical Activity:    Days of Exercise per Week: Not on file   Minutes of Exercise per Session: Not on file  Stress:    Feeling of Stress : Not on file  Social Connections:    Frequency of Communication with Friends and Family: Not on file   Frequency of Social Gatherings with Friends and Family: Not on file   Attends Religious Services: Not on file   Active Member of Clubs or Organizations: Not on file   Attends Banker Meetings: Not on file   Marital Status: Not on file  Intimate Partner Violence:    Fear of Current or Ex-Partner: Not on file   Emotionally Abused: Not on file   Physically Abused: Not on file   Sexually Abused: Not on  file    Vital Signs: Blood pressure 136/84, height 6\' 1"  (1.854 m), weight 258 lb (117 kg), SpO2 96 %.  Examination: General Appearance: The patient is well-developed, well-nourished, and in no distress. Neck Circumference: 54 Skin: Gross inspection of skin unremarkable. Head: normocephalic, no gross deformities. Eyes: no gross deformities noted. ENT: ears appear grossly normal Neurologic: Alert and oriented. No involuntary movements.    EPWORTH SLEEPINESS SCALE:  Scale:  (0)= no chance of dozing; (1)= slight chance of dozing; (2)= moderate chance of dozing; (3)= high chance of dozing  Chance  Situtation    Sitting and reading: 1    Watching TV: 1    Sitting Inactive in public: 0    As a passenger in car: 1      Lying down to rest: 3    Sitting and talking: 0    Sitting quielty after lunch: 1    In a car, stopped in traffic: 0   TOTAL SCORE:   7 out of 24    SLEEP STUDIES:  PSG 05-16-11 AHI 61 SpO88min 78%  CPAP COMPLIANCE DATA:  Date Range: 09/29/19-10/28/19  Average Daily Use: 12.8 hours  Median Use: 13  Compliance for > 4 Hours: 100%  AHI: 0.1 respiratory events per hour  Days Used: 30/30  Mask Leak:  18.2  95th Percentile Pressure: 14   LABS: No results found for this or any previous visit (from the past 2160 hour(s)).  Radiology: No results found.  Assessment and Plan: Patient Active Problem List   Diagnosis Date Noted   Sleep apnea 10/29/2019   S/P laparoscopic colectomy 09/25/2018   History of appendicitis    Acute appendicitis 06/26/2018   Hypomagnesemia 08/06/2017   Hypocalcemia 08/05/2017   Hypokalemia 08/04/2017      The patient is able to tolerate PAP and reports benefit from PAP use. The patient was reminded how to clean his machine by hand weekly with warm water and vingar and advised to allow water chamber to dry daily.  The compliance is excellent The apnea is very well controlled. He is eligible for a new machine, will get him set up for this today.   1. OSA continue excellent compliance 2. Shortness of breath-Has symptoms of SOB when he is exposed to cold environment such as cold drinks and AC. He has never been seen by pulmonologist. Advised he may need to speak to his PCP if symptoms persist or continue to be bothersome for further assessment and management.   General Counseling: I have discussed the findings of the evaluation and examination with 10/04/2017.  I have also discussed any further diagnostic evaluation thatmay be needed or ordered today. Tiburcio verbalizes understanding of the findings of todays visit. We also reviewed his medications today and discussed drug interactions and side effects including but not limited excessive drowsiness and altered mental states. We also discussed that there is always a risk not just to him but also people around him. he has been encouraged to call the office with any questions or concerns that should arise related to todays visit.     This patient was seen by Chrissie Noa AGNP-C in Collaboration with Dr. Leeanne Deed as a part of collaborative care agreement.   I have personally obtained a history, examined  the patient, evaluated laboratory and imaging results, formulated the assessment and plan and placed orders.   Freda Munro Valentino Hue, PhD, FAASM  Diplomate, American Board of Sleep Medicine    Sol Blazing, MD [facility]  Diplomate ABMS Pulmonary and Critical Care Medicine Sleep medicine

## 2020-01-07 ENCOUNTER — Ambulatory Visit: Payer: Medicare Other

## 2020-01-11 ENCOUNTER — Other Ambulatory Visit: Payer: Medicare Other

## 2020-01-15 ENCOUNTER — Ambulatory Visit: Admit: 2020-01-15 | Payer: Medicare Other

## 2020-01-15 SURGERY — ESOPHAGOGASTRODUODENOSCOPY (EGD) WITH PROPOFOL
Anesthesia: General

## 2020-02-04 ENCOUNTER — Ambulatory Visit (INDEPENDENT_AMBULATORY_CARE_PROVIDER_SITE_OTHER): Payer: Medicare Other | Admitting: Internal Medicine

## 2020-02-04 VITALS — BP 140/88 | HR 95 | Temp 97.9°F | Resp 20 | Ht 73.0 in | Wt 240.0 lb

## 2020-02-04 DIAGNOSIS — I10 Essential (primary) hypertension: Secondary | ICD-10-CM | POA: Diagnosis not present

## 2020-02-04 DIAGNOSIS — G4733 Obstructive sleep apnea (adult) (pediatric): Secondary | ICD-10-CM

## 2020-02-04 DIAGNOSIS — Z9989 Dependence on other enabling machines and devices: Secondary | ICD-10-CM | POA: Diagnosis not present

## 2020-02-04 DIAGNOSIS — Z7189 Other specified counseling: Secondary | ICD-10-CM | POA: Diagnosis not present

## 2020-02-04 NOTE — Patient Instructions (Signed)

## 2020-02-04 NOTE — Progress Notes (Signed)
[facility] [address] [place], [place] [address]  Pulmonary Sleep Medicine   Office Visit Note  Patient Name: [name] [name]. DOB: [birth year] MRN [id number]    Chief Complaint: Obstructive Sleep Apnea visit  Brief History:  [name] [name] is seen today for follow up The patient has a 8 year history of sleep apnea. Patient is using PAP nightly.  The patient feels more rested after sleeping with PAP.  The patient reports benefiting from PAP use. Reported sleepiness is  improved and the Epworth Sleepiness Score is 2 out of 24. The patient does take naps due to his medications. The patient complains of the following: no complaints  The compliance download shows excellent  compliance with an average use time of 11.2 hours. The AHI is 0.1  The patient does not complain of limb movements disrupting sleep.  ROS  General: (-) fever, (-) chills, (-) night sweat Nose and Sinuses: (-) nasal stuffiness or itchiness, (-) postnasal drip, (-) nosebleeds, (-) sinus trouble. Mouth and Throat: (-) sore throat, (-) hoarseness. Neck: (-) swollen glands, (-) enlarged thyroid, (-) neck pain. Respiratory: - cough, - shortness of breath, -- wheezing. Neurologic: - numbness, - tingling. Psychiatric: - anxiety, -- depression   Current Medication: Outpatient Encounter Medications as of 02/04/2020  Medication Sig  . albuterol (PROVENTIL HFA;VENTOLIN HFA) 108 (90 Base) MCG/ACT inhaler Inhale 2 puffs into the lungs every 6 (six) hours as needed for wheezing or shortness of breath.  Marland Kitchen atorvastatin (LIPITOR) 80 MG tablet Take 80 mg by mouth at bedtime.   . calcium-vitamin D (OSCAL 500/200 D-3) 500-200 MG-UNIT tablet Take 1 tablet by mouth 2 (two) times daily. (Patient taking differently: Take 1 tablet by mouth at bedtime. )  . cetirizine (ZYRTEC) 10 MG tablet Take 10 mg by mouth at bedtime.   . empagliflozin (JARDIANCE) 25 MG TABS tablet Take by mouth.  . fenofibrate (TRICOR) 145 MG tablet Take  145 mg by mouth daily.  Marland Kitchen glimepiride (AMARYL) 4 MG tablet Take 4 mg by mouth daily with breakfast.  . HUMIRA PEN-CD/UC/HS STARTER 80 MG/0.8ML PNKT Inject into the skin.  Marland Kitchen lamoTRIgine (LAMICTAL) 200 MG tablet Take 200 mg by mouth daily.  Marland Kitchen losartan (COZAAR) 50 MG tablet Take 50 mg by mouth at bedtime.   . magnesium oxide (MAG-OX) 400 (241.3 Mg) MG tablet Take 1 tablet (400 mg total) by mouth daily.  . Metoprolol Tartrate 75 MG TABS Take 112.5 mg by mouth daily.  Marland Kitchen NIFEdipine (PROCARDIA-XL/ADALAT CC) 30 MG 24 hr tablet Take 30 mg by mouth at bedtime.   Marland Kitchen nystatin (NYSTATIN) powder Apply 1 g topically 2 (two) times a day.  Marland Kitchen omeprazole (PRILOSEC) 40 MG capsule Take 40 mg by mouth daily.  . potassium chloride SA (KLOR-CON) 20 MEQ tablet Take 20 mEq by mouth daily.  . sitaGLIPtin-metformin (JANUMET) 50-500 MG tablet Take 1 tablet by mouth 2 (two) times daily with a meal.  . Vitamin D, Ergocalciferol, (DRISDOL) 50000 units CAPS capsule Take 1 capsule (50,000 Units total) by mouth every 7 (seven) days. (Patient taking differently: Take 50,000 Units by mouth every Friday. )   No facility-administered encounter medications on file as of 02/04/2020.    Surgical History: Past Surgical History:  Procedure Laterality Date  . COLONOSCOPY WITH PROPOFOL N/A 09/12/2018   Procedure: COLONOSCOPY WITH PROPOFOL;  Surgeon: Toney Reil, MD;  Location: [facility] ENDOSCOPY;  Service: Gastroenterology;  Laterality: N/A;  . Gum Graft    . LAPAROSCOPIC APPENDECTOMY N/A 09/25/2018  Procedure: APPENDECTOMY LAPAROSCOPIC;  Surgeon: Leafy Ro, MD;  Location: [facility] ORS;  Service: General;  Laterality: N/A;  . LAPAROSCOPIC RIGHT COLECTOMY Right 09/25/2018   Procedure: LAPAROSCOPIC RIGHT COLECTOMY;  Surgeon: Leafy Ro, MD;  Location: [facility] ORS;  Service: General;  Laterality: Right;    Medical History: Past Medical History:  Diagnosis Date  . Acne vulgaris   . Anxiety   . Depression   . Diabetes mellitus  without complication (HCC)   . GERD (gastroesophageal reflux disease)   . Hyperlipidemia   . Hypertension   . OSA (obstructive sleep apnea)     Family History: Non contributory to the present illness  Social History: Social History   Socioeconomic History  . Marital status: Married    Spouse name: Not on file  . Number of children: Not on file  . Years of education: Not on file  . Highest education level: Not on file  Occupational History  . Not on file  Tobacco Use  . Smoking status: Never Smoker  . Smokeless tobacco: Never Used  Vaping Use  . Vaping Use: Never used  Substance and Sexual Activity  . Alcohol use: No  . Drug use: No  . Sexual activity: Not on file  Other Topics Concern  . 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 Difficulty of Paying Living Expenses: Not on file  Food Insecurity:   . Worried About Programme researcher, broadcasting/film/video in the Last Year: Not on file  . Ran Out of Food in the Last Year: Not on file  Transportation Needs:   . Lack of Transportation (Medical): Not on file  . Lack of Transportation (Non-Medical): Not on file  Physical Activity:   . Days of Exercise per Week: Not on file  . Minutes of Exercise per Session: Not on file  Stress:   . Feeling of Stress : Not on file  Social Connections:   . Frequency of Communication with Friends and Family: Not on file  . Frequency of Social Gatherings with Friends and Family: Not on file  . Attends Religious Services: Not on file  . Active Member of Clubs or Organizations: Not on file  . Attends Banker Meetings: Not on file  . Marital Status: Not on file  Intimate Partner Violence:   . Fear of Current or Ex-Partner: Not on file  . Emotionally Abused: Not on file  . Physically Abused: Not on file  . Sexually Abused: Not on file    Vital  Signs: Blood pressure 140/88, pulse 95, temperature 97.9 F (36.6 C), temperature source Temporal, resp. rate 20, height 6\' 1"  (1.854 m), weight 240 lb (108.9 kg), SpO2 94 %.  Examination: General Appearance: The patient is well-developed, well-nourished, and in no distress. Neck Circumference: 49 Skin: Gross inspection of skin unremarkable. Head: normocephalic, no gross deformities. Eyes: no gross deformities noted. ENT: ears appear grossly normal Neurologic: Alert and oriented. No involuntary movements.    EPWORTH SLEEPINESS SCALE:  Scale:  (0)= no chance of dozing; (1)= slight chance of dozing; (2)= moderate chance of dozing; (3)= high chance of dozing  Chance  Situtation    Sitting and reading: 0    Watching TV: 0    Sitting Inactive in public: 0    As a passenger in car: 0  Lying down to rest: 2    Sitting and talking: 0    Sitting quielty after lunch: 0    In a car, stopped in traffic: 0   TOTAL SCORE:   2 out of 24    SLEEP STUDIES:  1.  PSG  05/16/2011 - AHI 61, low SpO2 78%   CPAP COMPLIANCE DATA:  Date Range: 01/02/20 - 01/31/20  Average Daily Use: 11:13 hours  Median Use: 10:58  Compliance for > 4 Hours: 100%  AHI: 0.1 respiratory events per hour  Days Used: 30/30  Mask Leak: 2.4 lpm  95th Percentile Pressure: 14 cmH2O         LABS: No results found for this or any previous visit (from the past 2160 hour(s)).  Radiology: No results found.  No results found.  No results found.    Assessment and Plan: Patient Active Problem List   Diagnosis Date Noted  . HTN (hypertension) 02/04/2020  . CPAP use counseling 02/04/2020  . OSA on CPAP 10/29/2019  . S/P laparoscopic colectomy 09/25/2018  . History of appendicitis   . Acute appendicitis 06/26/2018  . Hypomagnesemia 08/06/2017  . Hypocalcemia 08/05/2017  . Hypokalemia 08/04/2017      The patient does tolerate PAP and reports significant benefit from PAP use.  The patient was reminded how to clean the CPAP and advised to wash his mask. The patient was also counselled to continue his diet as he has lost 18 lbs. He should add regular exercise.The compliance is excellent. The apnea is very well controlled .   1. OSA- continue excellent compliance. 2. CPAP couseling-Discussed importance of adequate CPAP use as well as proper care and cleaning techniques of machine and all supplies. 3. HTN - controlled on medication, managed and followed by PCP.  4. Obesity discussed diet and exercise again  Obesity Counseling: Risk Assessment: An assessment of behavioral risk factors was made today and includes lack of exercise sedentary lifestyle, lack of portion control and poor dietary habits.  Risk Modification Advice: She was counseled on portion control guidelines. Restricting daily caloric intake to 2000 . The detrimental long term effects of obesity on her health and ongoing poor compliance was also discussed with the patient.    General Counseling: I have discussed the findings of the evaluation and examination with Chrissie Noa.  I have also discussed any further diagnostic evaluation thatmay be needed or ordered today. Trice verbalizes understanding of the findings of todays visit. We also reviewed his medications today and discussed drug interactions and side effects including but not limited excessive drowsiness and altered mental states. We also discussed that there is always a risk not just to him but also people around him. he has been encouraged to call the office with any questions or concerns that should arise related to todays visit.  No orders of the defined types were placed in this encounter.   This patient was seen by Layla Barter, AGNP-C in collaboration with Dr. Freda Munro as a part of collaborative care agreement.      I have personally obtained a history, examined the patient, evaluated laboratory and imaging results, formulated the  assessment and plan and placed orders.   Valentino Hue Sol Blazing, PhD, FAASM  Diplomate, American Board of Sleep Medicine    Yevonne Pax, MD [facility] Diplomate ABMS Pulmonary and Critical Care Medicine Sleep medicine

## 2020-02-06 ENCOUNTER — Other Ambulatory Visit: Payer: Self-pay

## 2020-02-06 ENCOUNTER — Encounter: Payer: Self-pay | Admitting: Urology

## 2020-02-06 ENCOUNTER — Ambulatory Visit (INDEPENDENT_AMBULATORY_CARE_PROVIDER_SITE_OTHER): Payer: BC Managed Care – PPO | Admitting: Urology

## 2020-02-06 VITALS — BP 138/88 | HR 92 | Ht 73.0 in | Wt 240.0 lb

## 2020-02-06 DIAGNOSIS — R350 Frequency of micturition: Secondary | ICD-10-CM

## 2020-02-06 DIAGNOSIS — N3281 Overactive bladder: Secondary | ICD-10-CM | POA: Diagnosis not present

## 2020-02-06 NOTE — Progress Notes (Signed)
02/06/2020 10:57 AM   [name] [name]. 1970-10-11 [id number]  Referring provider: Marguarite Arbour, MD [address] [facility] [place],  [place] [address]  Chief Complaint  Patient presents with  . Other    HPI: [name] [name], [name] [name]. is a 49 y.o. male seen at request of Dr. Judithann Sheen for evaluation of lower urinary tract symptoms.   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 Judithann Sheen and had a negative urinalysis and urine culture  He was given a trial of Flomax which did not help his symptoms  Has occasional mild dysuria  Denies gross hematuria  No flank, abdominal or pelvic pain  + Diabetes   PMH: Past Medical History:  Diagnosis Date  . Acne vulgaris   . Anxiety   . Depression   . Diabetes mellitus without complication (HCC)   . GERD (gastroesophageal reflux disease)   . Hyperlipidemia   . Hypertension   . OSA (obstructive sleep apnea)     Surgical History: Past Surgical History:  Procedure Laterality Date  . COLONOSCOPY WITH PROPOFOL N/A 09/12/2018   Procedure: COLONOSCOPY WITH PROPOFOL;  Surgeon: Toney Reil, MD;  Location: [facility] ENDOSCOPY;  Service: Gastroenterology;  Laterality: N/A;  . Gum Graft    . LAPAROSCOPIC APPENDECTOMY N/A 09/25/2018   Procedure: APPENDECTOMY LAPAROSCOPIC;  Surgeon: Leafy Ro, MD;  Location: [facility] ORS;  Service: General;  Laterality: N/A;  . LAPAROSCOPIC RIGHT  COLECTOMY Right 09/25/2018   Procedure: LAPAROSCOPIC RIGHT COLECTOMY;  Surgeon: Leafy Ro, MD;  Location: [facility] ORS;  Service: General;  Laterality: Right;    Home Medications:  Allergies as of 02/06/2020      Reactions   Penicillins Anaphylaxis, Other (See Comments)   Happened as a child. Has patient had a PCN reaction causing immediate rash, facial/tongue/throat swelling, SOB or lightheadedness with hypotension: Yes Has patient had a PCN reaction causing severe rash involving mucus membranes or skin necrosis: No Has patient had a PCN reaction that required hospitalization: Unknown Has patient had a PCN reaction occurring within the last 10 years: No If all of the above answers are "NO", then may proceed with Cephalosporin use.   Sulfa Antibiotics Anaphylaxis   Sulfasalazine Anaphylaxis   Azithromycin Nausea And Vomiting, Rash      Medication List       Accurate as of February 06, 2020 10:57 AM. If you have any questions, ask your nurse or doctor.        albuterol 108 (90 Base) MCG/ACT inhaler Commonly known as: VENTOLIN HFA Inhale 2 puffs into the lungs every 6 (six) hours as needed for wheezing or shortness of breath.   atorvastatin 80 MG tablet Commonly known as: LIPITOR Take 80 mg by mouth at bedtime.   calcium-vitamin D 500-200 MG-UNIT tablet Commonly known as: Oscal 500/200 D-3 Take 1 tablet by mouth 2 (two) times daily. What changed: when to take this   cetirizine 10 MG tablet Commonly known as: ZYRTEC  Take 10 mg by mouth at bedtime.   empagliflozin 25 MG Tabs tablet Commonly known as: JARDIANCE Take by mouth.   fenofibrate 145 MG tablet Commonly known as: TRICOR Take 145 mg by mouth daily.   glimepiride 4 MG tablet Commonly known as: AMARYL Take 4 mg by mouth daily with breakfast.   Humira Pen-CD/UC/HS Starter 80 MG/0.8ML Pnkt Generic drug: Adalimumab Inject into the skin.   lamoTRIgine 200 MG tablet Commonly known as: LAMICTAL Take 200 mg by  mouth daily.   losartan 50 MG tablet Commonly known as: COZAAR Take 50 mg by mouth at bedtime.   magnesium oxide 400 (241.3 Mg) MG tablet Commonly known as: MAG-OX Take 1 tablet (400 mg total) by mouth daily.   Metoprolol Tartrate 75 MG Tabs Take 112.5 mg by mouth daily.   NIFEdipine 30 MG 24 hr tablet Commonly known as: ADALAT CC Take 30 mg by mouth at bedtime.   nystatin powder Generic drug: nystatin Apply 1 g topically 2 (two) times a day.   omeprazole 40 MG capsule Commonly known as: PRILOSEC Take 40 mg by mouth daily.   potassium chloride SA 20 MEQ tablet Commonly known as: KLOR-CON Take 20 mEq by mouth daily.   risperiDONE 2 MG tablet Commonly known as: RISPERDAL Take 2 mg by mouth at bedtime.   sitaGLIPtin-metformin 50-500 MG tablet Commonly known as: JANUMET Take 1 tablet by mouth 2 (two) times daily with a meal.   Vitamin D (Ergocalciferol) 1.25 MG (50000 UNIT) Caps capsule Commonly known as: DRISDOL Take 1 capsule (50,000 Units total) by mouth every 7 (seven) days. What changed: when to take this       Allergies:  Allergies  Allergen Reactions  . Penicillins Anaphylaxis and Other (See Comments)    Happened as a child. Has patient had a PCN reaction causing immediate rash, facial/tongue/throat swelling, SOB or lightheadedness with hypotension: Yes Has patient had a PCN reaction causing severe rash involving mucus membranes or skin necrosis: No Has patient had a PCN reaction that required hospitalization: Unknown Has patient had a PCN reaction occurring within the last 10 years: No If all of the above answers are "NO", then may proceed with Cephalosporin use.  . Sulfa Antibiotics Anaphylaxis  . Sulfasalazine Anaphylaxis  . Azithromycin Nausea And Vomiting and Rash    Family History: Family History  Problem Relation Age of Onset  . Colon cancer Mother   . CAD Father   . Diabetes Sister     Social History:  reports that he has never smoked. He  has never used smokeless tobacco. He reports that he does not drink alcohol and does not use drugs.   Physical Exam: BP 138/88   Pulse 92   Ht 6\' 1"  (1.854 m)   Wt 240 lb (108.9 kg)   BMI 31.66 kg/m   Constitutional:  Alert, No acute distress. HEENT: [place] AT, moist mucus membranes.  Trachea midline, no masses. Cardiovascular: No clubbing, cyanosis, or edema. Respiratory: Normal respiratory effort, no increased work of breathing. GI: Abdomen is soft, nontender, nondistended, no abdominal masses GU: Phallus without lesions, urethral meatus normal in appearance and caliber.  Testes descended bilaterally without masses or tenderness.  Prostate 30 g, smooth without nodules Skin: No rashes, bruises or suspicious lesions. Neurologic: Grossly intact, no focal deficits, moving all 4 extremities.   Assessment & Plan:    1.  Overactive bladder  Urinalysis unremarkable  Trial oxybutynin IR 5 mg tid prn frequency, urgency  Follow-up as needed  for persistent or worsening symptoms    Riki Altes, MD  [facility] [address], [address] [place], [place] [address] [phone]

## 2020-02-07 ENCOUNTER — Encounter: Payer: Self-pay | Admitting: Urology

## 2020-02-07 LAB — URINALYSIS, COMPLETE
Bilirubin, UA: NEGATIVE
Ketones, UA: NEGATIVE
Leukocytes,UA: NEGATIVE
Nitrite, UA: NEGATIVE
Protein,UA: NEGATIVE
RBC, UA: NEGATIVE
Specific Gravity, UA: >1.03 — ABNORMAL HIGH (ref 1.005–1.030)
Urobilinogen, Ur: 0.2 mg/dL (ref 0.2–1.0)
pH, UA: 5 (ref 5.0–7.5)

## 2020-02-07 LAB — MICROSCOPIC EXAMINATION
Bacteria, UA: NONE SEEN
RBC, Urine: NONE SEEN /hpf (ref 0–2)

## 2020-02-07 MED ORDER — OXYBUTYNIN CHLORIDE 5 MG PO TABS: ORAL_TABLET | ORAL | 3 refills | Status: DC

## 2021-02-02 ENCOUNTER — Ambulatory Visit (INDEPENDENT_AMBULATORY_CARE_PROVIDER_SITE_OTHER): Payer: Medicare Other | Admitting: Internal Medicine

## 2021-02-02 VITALS — BP 130/83 | HR 73 | Resp 18 | Ht 72.0 in | Wt 229.0 lb

## 2021-02-02 DIAGNOSIS — Z7189 Other specified counseling: Secondary | ICD-10-CM

## 2021-02-02 DIAGNOSIS — F419 Anxiety disorder, unspecified: Secondary | ICD-10-CM | POA: Insufficient documentation

## 2021-02-02 DIAGNOSIS — I10 Essential (primary) hypertension: Secondary | ICD-10-CM | POA: Diagnosis not present

## 2021-02-02 DIAGNOSIS — Z9989 Dependence on other enabling machines and devices: Secondary | ICD-10-CM

## 2021-02-02 DIAGNOSIS — E785 Hyperlipidemia, unspecified: Secondary | ICD-10-CM | POA: Insufficient documentation

## 2021-02-02 DIAGNOSIS — F32A Depression, unspecified: Secondary | ICD-10-CM | POA: Insufficient documentation

## 2021-02-02 DIAGNOSIS — G4733 Obstructive sleep apnea (adult) (pediatric): Secondary | ICD-10-CM | POA: Diagnosis not present

## 2021-02-02 NOTE — Progress Notes (Signed)
[facility] [place], [place] [address]  Pulmonary Sleep Medicine   Office Visit Note  Patient Name: [name] [name]. DOB: 09/15/1970 MRN [id number]    Chief Complaint: Obstructive Sleep Apnea visit  Brief History:  [name] [name] is seen today for annual The patient has a 9 year history of sleep apnea. Patient is using PAP nightly.  The patient feels better after sleeping with PAP.  The patient reports benefiting from PAP use. Epworth Sleepiness Score is 2 out of 24. The patient does take naps  on weekend with PAP therapy. The patient complains of the following: c/o occasionally gasping & reviewed cleaning  The compliance download shows  compliance with an average use time of 12:14 hours100 @ 98%. The AHI is 0.1  The patient does not complain of limb movements disrupting sleep.  ROS  General: (-) fever, (-) chills, (-) night sweat Nose and Sinuses: (-) nasal stuffiness or itchiness, (-) postnasal drip, (-) nosebleeds, (-) sinus trouble. Mouth and Throat: (-) sore throat, (-) hoarseness. Neck: (-) swollen glands, (-) enlarged thyroid, (-) neck pain. Respiratory: - cough, - shortness of breath, - wheezing. Neurologic: - numbness, - tingling. Psychiatric: + anxiety, - depression   Current Medication: Outpatient Encounter Medications as of 02/02/2021  Medication Sig   Adalimumab (HUMIRA PEN) 40 MG/0.4ML PNKT    atorvastatin (LIPITOR) 80 MG tablet Take 1 tablet by mouth daily.   clonazePAM (KLONOPIN) 0.5 MG tablet Take by mouth.   empagliflozin (JARDIANCE) 25 MG TABS tablet TAKE 1 TABLET (25 MG TOTAL) BY MOUTH DAILY WITH BREAKFAST   ergocalciferol (VITAMIN D2) 1.25 MG (50000 UT) capsule Take 1 capsule by mouth every 7 (seven) days.   glimepiride (AMARYL) 2 MG tablet Take by mouth.   LamoTRIgine 250 MG TB24 24 hour tablet Take by mouth.   losartan (COZAAR) 50 MG tablet Take 1 tablet by mouth daily.   magnesium oxide (MAG-OX) 400 MG tablet Take 1 tablet by  mouth daily.   NIFEdipine (PROCARDIA-XL/NIFEDICAL-XL) 30 MG 24 hr tablet TAKE 1 TABLET BY MOUTH EVERYDAY AT BEDTIME   omeprazole (PRILOSEC) 40 MG capsule Take by mouth.   potassium chloride (KLOR-CON M) 10 MEQ tablet Take by mouth.   propranolol (INDERAL) 40 MG tablet Take 1 tablet by mouth 2 (two) times daily.   risperiDONE (RISPERDAL) 3 MG tablet Take by mouth.   traZODone (DESYREL) 100 MG tablet Take by mouth.   albuterol (PROVENTIL HFA;VENTOLIN HFA) 108 (90 Base) MCG/ACT inhaler Inhale 2 puffs into the lungs every 6 (six) hours as needed for wheezing or shortness of breath.   atorvastatin (LIPITOR) 80 MG tablet Take 80 mg by mouth at bedtime.    calcium-vitamin D (OSCAL 500/200 D-3) 500-200 MG-UNIT tablet Take 1 tablet by mouth 2 (two) times daily. (Patient taking differently: Take 1 tablet by mouth at bedtime. )   cetirizine (ZYRTEC) 10 MG tablet Take 10 mg by mouth at bedtime.    fenofibrate (TRICOR) 145 MG tablet Take 145 mg by mouth daily.   fexofenadine (ALLEGRA) 180 MG tablet Take by mouth.   glimepiride (AMARYL) 4 MG tablet Take 4 mg by mouth daily with breakfast.   HUMIRA PEN-CD/UC/HS STARTER 80 MG/0.8ML PNKT Inject into the skin.   lamoTRIgine (LAMICTAL) 200 MG tablet Take 200 mg by mouth daily.   losartan (COZAAR) 50 MG tablet Take 50 mg by mouth at bedtime.    magnesium oxide (MAG-OX) 400 (241.3 Mg) MG tablet Take 1 tablet (400 mg total) by  mouth daily.   Metoprolol Tartrate 75 MG TABS Take 112.5 mg by mouth daily.   NIFEdipine (PROCARDIA-XL/ADALAT CC) 30 MG 24 hr tablet Take 30 mg by mouth at bedtime.    nystatin (NYSTATIN) powder Apply 1 g topically 2 (two) times a day.   omeprazole (PRILOSEC) 40 MG capsule Take 40 mg by mouth daily.   oxybutynin (DITROPAN) 5 MG tablet 1 tab tid prn frequency,urgency, bladder spasm   potassium chloride SA (KLOR-CON) 20 MEQ tablet Take 20 mEq by mouth daily.   risperiDONE (RISPERDAL) 2 MG tablet Take 2 mg by mouth at bedtime.    sitaGLIPtin-metformin (JANUMET) 50-500 MG tablet Take 1 tablet by mouth 2 (two) times daily with a meal.   Vitamin D, Ergocalciferol, (DRISDOL) 50000 units CAPS capsule Take 1 capsule (50,000 Units total) by mouth every 7 (seven) days. (Patient taking differently: Take 50,000 Units by mouth every Friday. )   No facility-administered encounter medications on file as of 02/02/2021.    Surgical History: Past Surgical History:  Procedure Laterality Date   COLONOSCOPY WITH PROPOFOL N/A 09/12/2018   Procedure: COLONOSCOPY WITH PROPOFOL;  Surgeon: Toney Reil, MD;  Location: [facility] ENDOSCOPY;  Service: Gastroenterology;  Laterality: N/A;   Gum Graft     LAPAROSCOPIC APPENDECTOMY N/A 09/25/2018   Procedure: APPENDECTOMY LAPAROSCOPIC;  Surgeon: Leafy Ro, MD;  Location: [facility] ORS;  Service: General;  Laterality: N/A;   LAPAROSCOPIC RIGHT COLECTOMY Right 09/25/2018   Procedure: LAPAROSCOPIC RIGHT COLECTOMY;  Surgeon: Leafy Ro, MD;  Location: [facility] ORS;  Service: General;  Laterality: Right;    Medical History: Past Medical History:  Diagnosis Date   Acne vulgaris    Anxiety    Depression    Diabetes mellitus without complication (HCC)    GERD (gastroesophageal reflux disease)    Hyperlipidemia    Hypertension    OSA (obstructive sleep apnea)     Family History: Non contributory to the present illness  Social History: Social History   Socioeconomic History   Marital status: Married    Spouse name: Not on file   Number of children: Not on file   Years of education: Not on file   Highest education level: Not on file  Occupational History   Not on file  Tobacco Use   Smoking status: Never   Smokeless tobacco: Never  Vaping Use   Vaping Use: Never used  Substance and Sexual Activity   Alcohol use: No   Drug use: No   Sexual activity: Not on file  Other Topics Concern   Not on file  Social History Narrative   Lives at home with family   Ambulates independently    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 rate 18, height 6' (1.829 m), weight 229 lb (103.9 kg), SpO2 96 %. Body mass index is 31.06 kg/m.    Examination: General Appearance: The patient is well-developed, well-nourished, and in no distress. Neck Circumference: 48 Skin: Gross inspection of skin unremarkable. Head: normocephalic, no gross deformities. Eyes: no gross deformities noted. ENT: ears appear grossly normal Neurologic: Alert and oriented. No involuntary movements.    EPWORTH SLEEPINESS SCALE:  Scale:  (  0)= no chance of dozing; (1)= slight chance of dozing; (2)= moderate chance of dozing; (3)= high chance of dozing  Chance  Situtation    Sitting and reading: 0    Watching TV: 1    Sitting Inactive in public: 0    As a passenger in car: 0      Lying down to rest: 1    Sitting and talking: 0    Sitting quielty after lunch: 0    In a car, stopped in traffic: 0   TOTAL SCORE:   2 out of 24    SLEEP STUDIES:  PSG  05/16/2011 - AHI 61, SpO2  min 78%,   CPAP COMPLIANCE DATA:  Date Range: 01/30/20 - 01/28/21  Average Daily Use: 12:14 hours  Median Use: 12:05  Compliance for > 4 Hours: 100%   AHI: 0.1 respiratory events per hour  Days Used: 365/365  Mask Leak: 34.2 lpm  95th Percentile Pressure: 14 cmH2o         LABS: No results found for this or any previous visit (from the past 2160 hour(s)).  Radiology: No results found.  No results found.  No results found.    Assessment and Plan: Patient Active Problem List   Diagnosis Date Noted   Obesity, morbid ([place]) 02/02/2021   Hypertension 02/02/2021   Anxiety and depression  02/02/2021   HLD (hyperlipidemia) 02/02/2021   HTN (hypertension) 02/04/2020   CPAP use counseling 02/04/2020   OSA on CPAP 10/29/2019   Gastroesophageal reflux disease without esophagitis 09/18/2019   S/P laparoscopic colectomy 09/25/2018   History of appendicitis    Acute appendicitis 06/26/2018   Type II diabetes mellitus with manifestations ([place]) 03/29/2018   Hypomagnesemia 08/06/2017   Hypocalcemia 08/05/2017   Hypokalemia 08/04/2017    1. OSA on CPAP The patient does  tolerate PAP and reports definite benefit from PAP use. The patient was reminded how to clean equipment  and advised to replace supplies more frequently. The patient was also counselled on weight loss and taking shorter naps. The compliance is excellent. The AHI is 0.1.   OSA- continue excellent compliance with pap. F/u one year   2. CPAP use counseling CPAP Counseling: had a lengthy discussion with the patient regarding the importance of PAP therapy in management of the sleep apnea. Patient appears to understand the risk factor reduction and also understands the risks associated with untreated sleep apnea. Patient will try to make a good faith effort to remain compliant with therapy. Also instructed the patient on proper cleaning of the device including the water must be changed daily if possible and use of distilled water is preferred. Patient understands that the machine should be regularly cleaned with appropriate recommended cleaning solutions that do not damage the PAP machine for example given white vinegar and water rinses. Other methods such as ozone treatment may not be as good as these simple methods to achieve cleaning.   3. Obesity, morbid ([place]) Obesity Counseling: Had a lengthy discussion regarding patients BMI and weight issues. Patient was instructed on portion control as well as increased activity. Also discussed caloric restrictions with trying to maintain intake less than 2000 Kcal. Discussions were  made in accordance with the 5As of weight management. Simple actions such as not eating late and if able to, taking a walk is suggested.   4. Hypertension, unspecified type Hypertension Counseling:   The following hypertensive lifestyle modification were recommended and discussed:  1. Limiting alcohol intake to less than 1  oz/day of ethanol:(24 oz of beer or 8 oz of wine or 2 oz of 100-proof whiskey). 2. Take baby ASA 81 mg daily. 3. Importance of regular aerobic exercise and losing weight. 4. Reduce dietary saturated fat and cholesterol intake for overall cardiovascular health. 5. Maintaining adequate dietary potassium, calcium, and magnesium intake. 6. Regular monitoring of the blood pressure. 7. Reduce sodium intake to less than 100 mmol/day (less than 2.3 gm of sodium or less than 6 gm of sodium choride)     General Counseling: I have discussed the findings of the evaluation and examination with [name] [name].  I have also discussed any further diagnostic evaluation thatmay be needed or ordered today. [name] [name] verbalizes understanding of the findings of todays visit. We also reviewed his medications today and discussed drug interactions and side effects including but not limited excessive drowsiness and altered mental states. We also discussed that there is always a risk not just to him but also people around him. he has been encouraged to call the office with any questions or concerns that should arise related to todays visit.  No orders of the defined types were placed in this encounter.       I have personally obtained a history, examined the patient, evaluated laboratory and imaging results, formulated the assessment and plan and placed orders. This patient was seen today by Tressie Ellis, PA-C in collaboration with Dr. Devona Konig.   Allyne Gee, MD [facility] Diplomate ABMS Pulmonary Critical Care Medicine and Sleep Medicine

## 2021-02-02 NOTE — Patient Instructions (Signed)

## 2021-02-26 IMAGING — CT CT ABDOMEN AND PELVIS WITH CONTRAST
2 of 5 series · 16 of 46 positions shown, 18 images · IV contrast (APPLIED)
Comparison: CT scan of June 26, 2018.

CLINICAL DATA: Appendicitis.  Febrile.

EXAM:
CT ABDOMEN AND PELVIS WITH CONTRAST
TECHNIQUE: Multidetector CT imaging of the abdomen and pelvis was performed
using the standard protocol following bolus administration of
intravenous contrast.
CONTRAST:  125mL OMNIPAQUE IOHEXOL 300 MG/ML  SOLN

[Series 2: routine abd/pel with · axial · 0.98mm/px · z∈[-217,+253]mm · 13 of 106 slices shown, 15 images]
[im 6/106  soft-tissue]
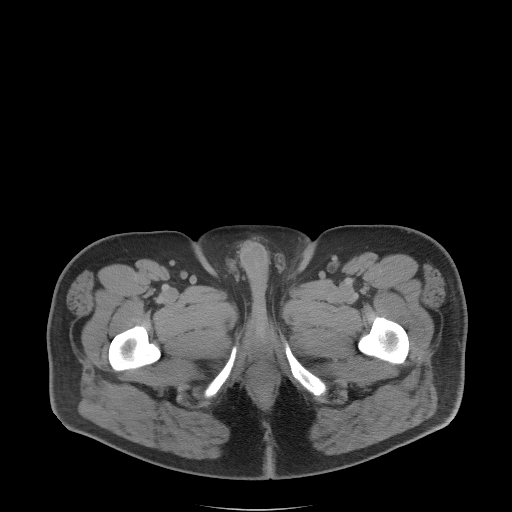
[im 6/106  bone]
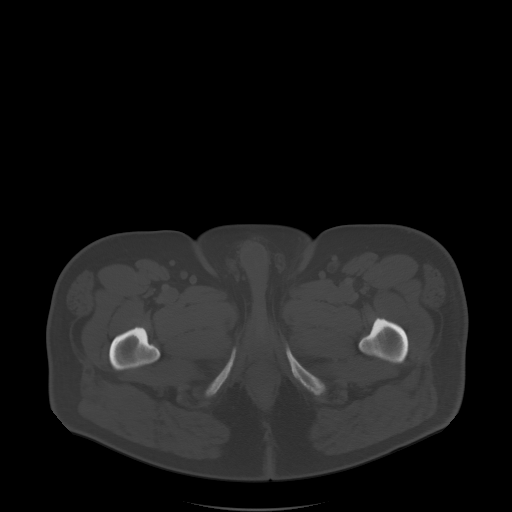
[im 17/106  soft-tissue]
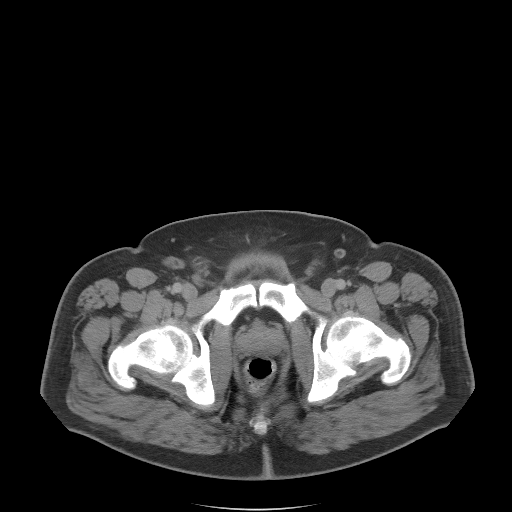
[im 23/106  soft-tissue]
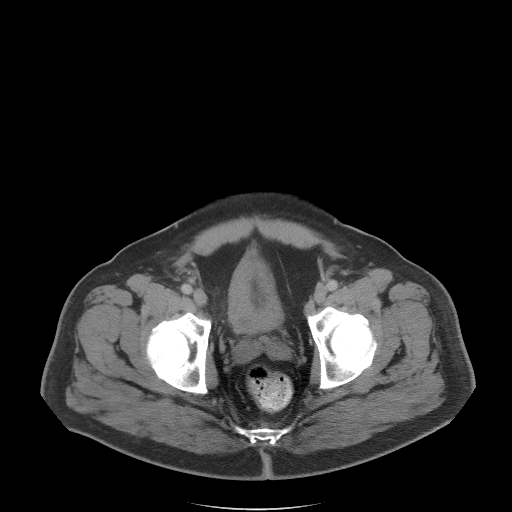
[im 28/106  soft-tissue]
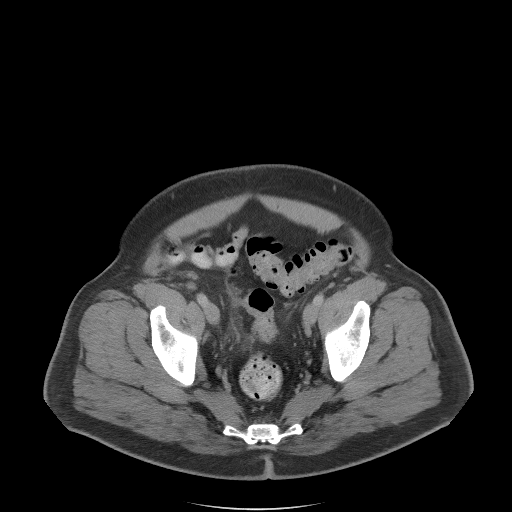
[im 39/106  soft-tissue]
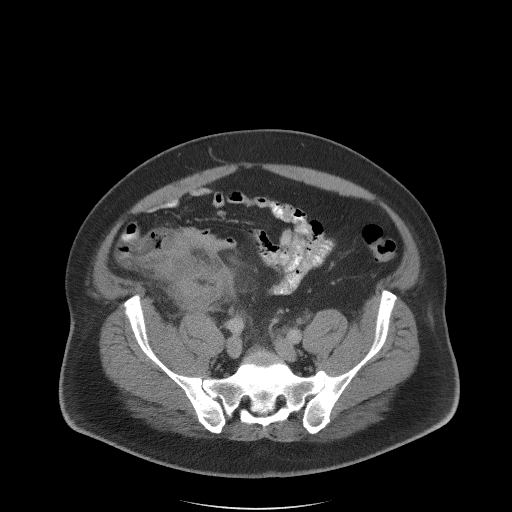
[im 45/106  soft-tissue]
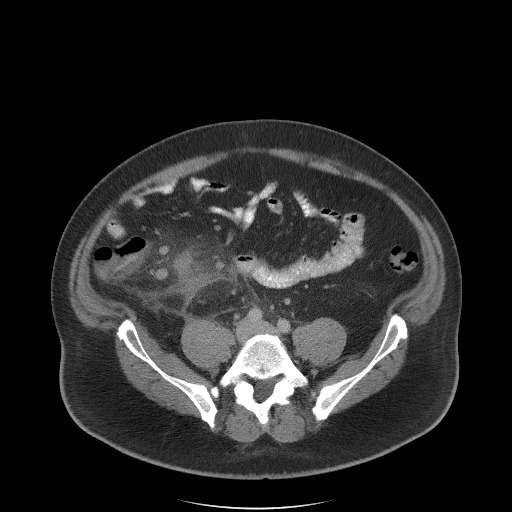
[im 56/106  soft-tissue]
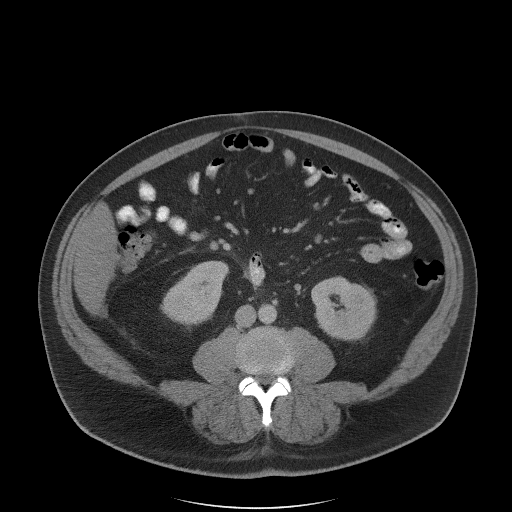
[im 61/106  soft-tissue]
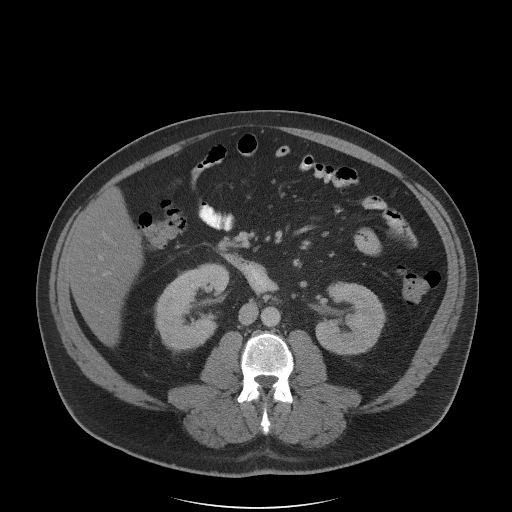
[im 67/106  soft-tissue]
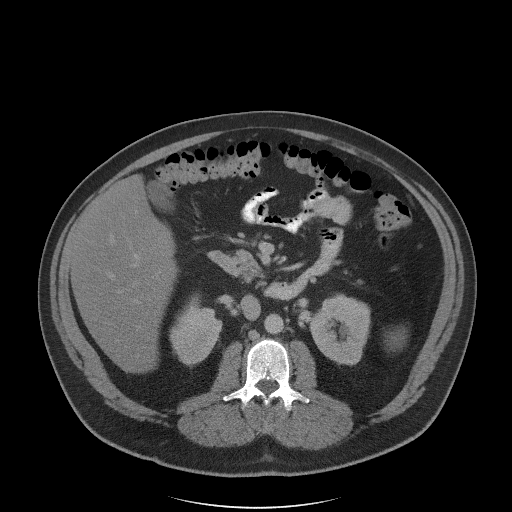
[im 67/106  bone]
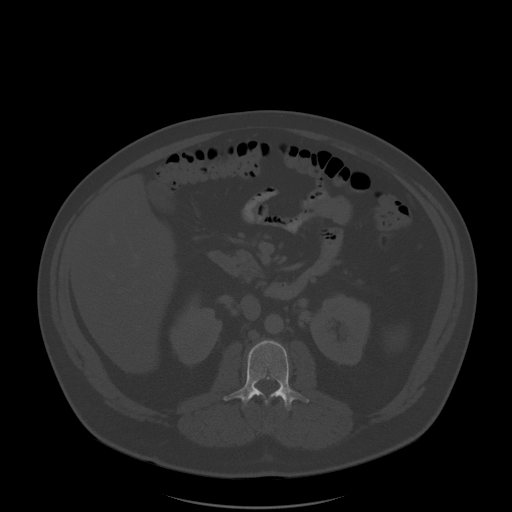
[im 78/106  soft-tissue]
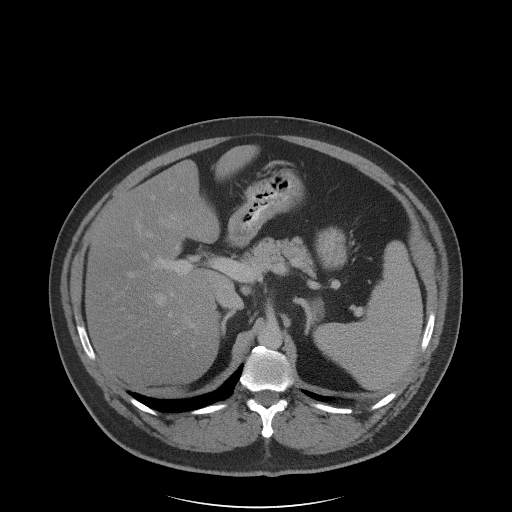
[im 83/106  soft-tissue]
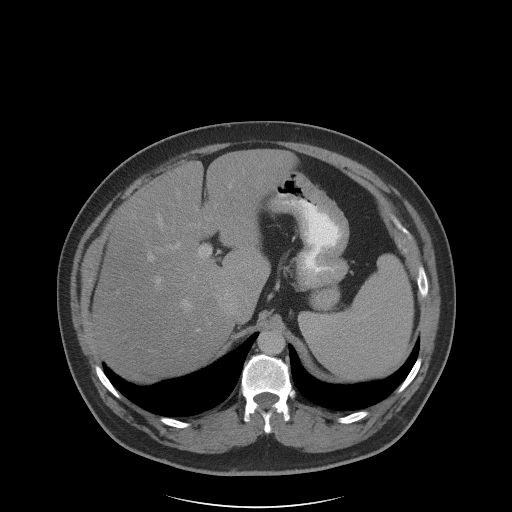
[im 89/106  soft-tissue]
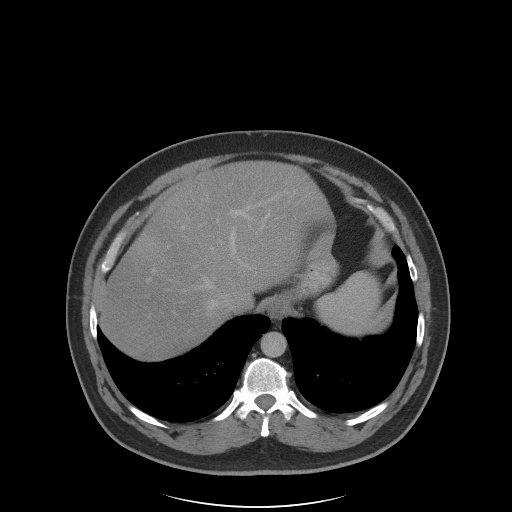
[im 100/106  soft-tissue]
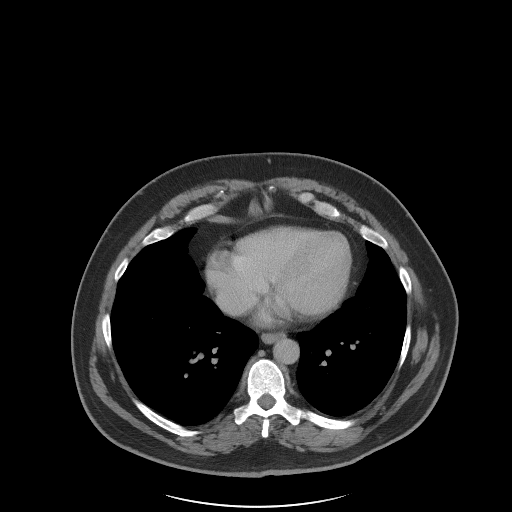

[Series 5: coronal st · coronal · 0.85mm/px · 3 of 124 slices shown]
[im 42/124  soft-tissue]
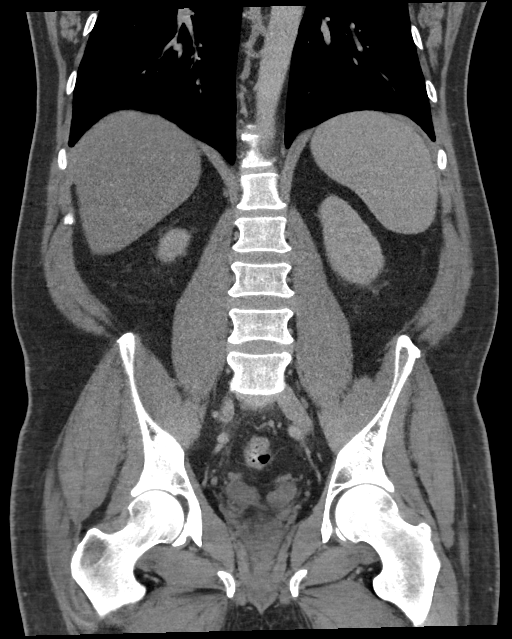
[im 55/124  soft-tissue]
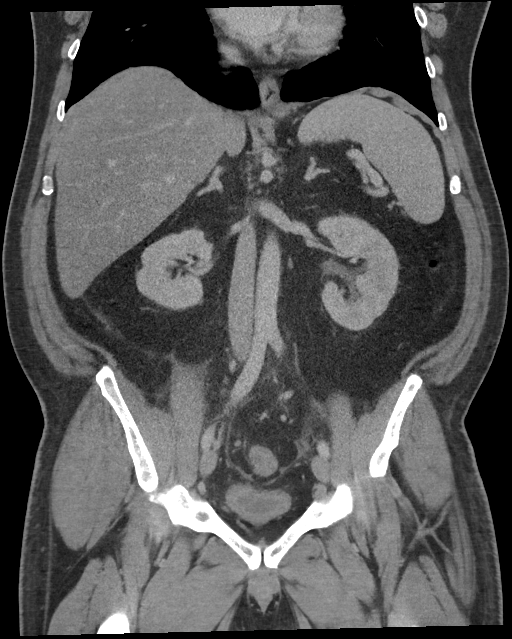
[im 69/124  soft-tissue]
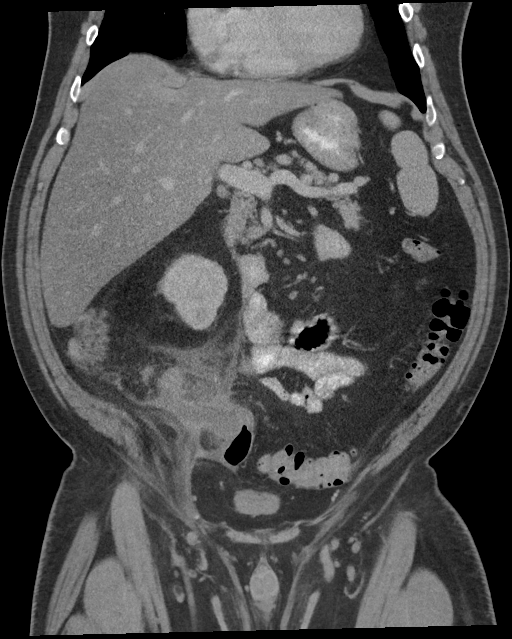

[16 of 46 positions shown; findings below may reference images not displayed]

FINDINGS: Lower chest: No acute abnormality.

Hepatobiliary: No cholelithiasis or biliary dilatation is noted.
Hepatic steatosis is noted.

Pancreas: Unremarkable. No pancreatic ductal dilatation or
surrounding inflammatory changes.

Spleen: Normal in size without focal abnormality.

Adrenals/Urinary Tract: Adrenal glands are unremarkable. Kidneys are
normal, without renal calculi, focal lesion, or hydronephrosis.
Bladder is unremarkable.

Stomach/Bowel: The stomach appears normal. There is no evidence of
bowel obstruction. There is continued presence of extensive
inflammatory changes in the right lower quadrant consistent with
appendicitis and associated phlegmon. There is now noted 3.7 x
cm fluid collection in this area concerning for abscess.

Vascular/Lymphatic: No significant vascular findings are present. No
enlarged abdominal or pelvic lymph nodes.

Reproductive: Prostate is unremarkable.

Other: No abdominal wall hernia or abnormality. No abdominopelvic
ascites.

Musculoskeletal: No acute or significant osseous findings.
IMPRESSION: Continued presence of extensive inflammatory changes in right lower
quadrant consistent with appendicitis and associated phlegmon. There
is now noted 3.7 x 3.2 cm fluid collection in this area concerning
for abscess.

Hepatic steatosis.

## 2022-02-01 ENCOUNTER — Ambulatory Visit (INDEPENDENT_AMBULATORY_CARE_PROVIDER_SITE_OTHER): Payer: Medicare Other | Admitting: Internal Medicine

## 2022-02-01 VITALS — BP 131/77 | HR 89 | Resp 16 | Ht 73.0 in | Wt 240.0 lb

## 2022-02-01 DIAGNOSIS — I10 Essential (primary) hypertension: Secondary | ICD-10-CM | POA: Diagnosis not present

## 2022-02-01 DIAGNOSIS — Z7189 Other specified counseling: Secondary | ICD-10-CM | POA: Diagnosis not present

## 2022-02-01 DIAGNOSIS — G4733 Obstructive sleep apnea (adult) (pediatric): Secondary | ICD-10-CM | POA: Diagnosis not present

## 2022-02-01 NOTE — Progress Notes (Signed)
[facility] [address] [place], [place] [address]  Pulmonary Sleep Medicine   Office Visit Note  Patient Name: [name] [name]. DOB: 01-Nov-1970 MRN [id number]    Chief Complaint: Obstructive Sleep Apnea visit  Brief History:  [name] [name] is seen today for an annual follow up on CPAP at 14 cmh20.  The patient has a 10 year history of sleep apnea. Patient is using PAP nightly.  The patient feels rested after sleeping with PAP.  The patient reports benefiting from PAP use. Reported sleepiness is  improved and the Epworth Sleepiness Score is 4 out of 24. The patient does take naps daily for about a couple hours. The patient complains of the following: No complaints.  The compliance download shows 100% compliance with an average use time of 11 hours 57 minutes. The AHI is 0.1.  The patient does not complain of limb movements disrupting sleep.  ROS  General: (-) fever, (-) chills, (-) night sweat Nose and Sinuses: (-) nasal stuffiness or itchiness, (-) postnasal drip, (-) nosebleeds, (-) sinus trouble. Mouth and Throat: (-) sore throat, (-) hoarseness. Neck: (-) swollen glands, (-) enlarged thyroid, (-) neck pain. Respiratory: - cough, - shortness of breath, - wheezing. Neurologic: - numbness, - tingling. Psychiatric: - anxiety, - depression   Current Medication: Outpatient Encounter Medications as of 02/01/2022  Medication Sig   Adalimumab (HUMIRA PEN) 40 MG/0.4ML PNKT    albuterol (PROVENTIL HFA;VENTOLIN HFA) 108 (90 Base) MCG/ACT inhaler Inhale 2 puffs into the lungs every 6 (six) hours as needed for wheezing or shortness of breath.   atorvastatin (LIPITOR) 80 MG tablet Take 80 mg by mouth at bedtime.    atorvastatin (LIPITOR) 80 MG tablet Take 1 tablet by mouth daily.   calcium-vitamin D (OSCAL 500/200 D-3) 500-200 MG-UNIT tablet Take 1 tablet by mouth 2 (two) times daily. (Patient taking differently: Take 1 tablet by mouth at bedtime. )   cetirizine (ZYRTEC) 10 MG  tablet Take 10 mg by mouth at bedtime.    clonazePAM (KLONOPIN) 0.5 MG tablet Take by mouth.   empagliflozin (JARDIANCE) 25 MG TABS tablet TAKE 1 TABLET (25 MG TOTAL) BY MOUTH DAILY WITH BREAKFAST   ergocalciferol (VITAMIN D2) 1.25 MG (50000 UT) capsule Take 1 capsule by mouth every 7 (seven) days.   fenofibrate (TRICOR) 145 MG tablet Take 145 mg by mouth daily.   fexofenadine (ALLEGRA) 180 MG tablet Take by mouth.   glimepiride (AMARYL) 2 MG tablet Take by mouth.   glimepiride (AMARYL) 4 MG tablet Take 4 mg by mouth daily with breakfast.   HUMIRA PEN-CD/UC/HS STARTER 80 MG/0.8ML PNKT Inject into the skin.   lamoTRIgine (LAMICTAL) 200 MG tablet Take 200 mg by mouth daily.   LamoTRIgine 250 MG TB24 24 hour tablet Take by mouth.   losartan (COZAAR) 50 MG tablet Take 50 mg by mouth at bedtime.    losartan (COZAAR) 50 MG tablet Take 1 tablet by mouth daily.   magnesium oxide (MAG-OX) 400 (241.3 Mg) MG tablet Take 1 tablet (400 mg total) by mouth daily.   magnesium oxide (MAG-OX) 400 MG tablet Take 1 tablet by mouth daily.   Metoprolol Tartrate 75 MG TABS Take 112.5 mg by mouth daily.   NIFEdipine (PROCARDIA-XL/ADALAT CC) 30 MG 24 hr tablet Take 30 mg by mouth at bedtime.    NIFEdipine (PROCARDIA-XL/NIFEDICAL-XL) 30 MG 24 hr tablet TAKE 1 TABLET BY MOUTH EVERYDAY AT BEDTIME   nystatin (NYSTATIN) powder Apply 1 g topically 2 (two) times a  day.   omeprazole (PRILOSEC) 40 MG capsule Take 40 mg by mouth daily.   omeprazole (PRILOSEC) 40 MG capsule Take by mouth.   oxybutynin (DITROPAN) 5 MG tablet 1 tab tid prn frequency,urgency, bladder spasm   potassium chloride SA (KLOR-CON) 20 MEQ tablet Take 20 mEq by mouth daily.   propranolol (INDERAL) 40 MG tablet Take 1 tablet by mouth 2 (two) times daily.   risperiDONE (RISPERDAL) 2 MG tablet Take 2 mg by mouth at bedtime.   risperiDONE (RISPERDAL) 3 MG tablet Take by mouth.   sitaGLIPtin-metformin (JANUMET) 50-500 MG tablet Take 1 tablet by mouth 2 (two)  times daily with a meal.   traZODone (DESYREL) 100 MG tablet Take by mouth.   Vitamin D, Ergocalciferol, (DRISDOL) 50000 units CAPS capsule Take 1 capsule (50,000 Units total) by mouth every 7 (seven) days. (Patient taking differently: Take 50,000 Units by mouth every Friday. )   No facility-administered encounter medications on file as of 02/01/2022.    Surgical History: Past Surgical History:  Procedure Laterality Date   COLONOSCOPY WITH PROPOFOL N/A 09/12/2018   Procedure: COLONOSCOPY WITH PROPOFOL;  Surgeon: Toney Reil, MD;  Location: [facility] ENDOSCOPY;  Service: Gastroenterology;  Laterality: N/A;   Gum Graft     LAPAROSCOPIC APPENDECTOMY N/A 09/25/2018   Procedure: APPENDECTOMY LAPAROSCOPIC;  Surgeon: Leafy Ro, MD;  Location: [facility] ORS;  Service: General;  Laterality: N/A;   LAPAROSCOPIC RIGHT COLECTOMY Right 09/25/2018   Procedure: LAPAROSCOPIC RIGHT COLECTOMY;  Surgeon: Leafy Ro, MD;  Location: [facility] ORS;  Service: General;  Laterality: Right;    Medical History: Past Medical History:  Diagnosis Date   Acne vulgaris    Anxiety    Depression    Diabetes mellitus without complication (HCC)    GERD (gastroesophageal reflux disease)    Hyperlipidemia    Hypertension    OSA (obstructive sleep apnea)     Family History: Non contributory to the present illness  Social History: Social History   Socioeconomic History   Marital status: Married    Spouse name: Not on file   Number of children: Not on file   Years of education: Not on file   Highest education level: Not on file  Occupational History   Not on file  Tobacco Use   Smoking status: Never   Smokeless tobacco: Never  Vaping Use   Vaping Use: Never used  Substance and Sexual Activity   Alcohol use: No   Drug use: No   Sexual activity: Not on file  Other Topics Concern   Not on file  Social History Narrative   Lives at home with family   Ambulates independently   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 There is no height or weight on file to calculate BMI.    Examination: General Appearance: The patient is well-developed, well-nourished, and in no distress. Neck Circumference: 49 cm Skin: Gross inspection of skin unremarkable. Head: normocephalic, no gross deformities. Eyes: no gross deformities noted. ENT: ears appear grossly normal Neurologic: Alert and oriented. No involuntary movements.  STOP BANG RISK ASSESSMENT S (snore) Have you been told that you snore?  NO   T (tired) Are you often tired, fatigued, or sleepy during the day?   NO  O (obstruction) Do you stop breathing, choke, or gasp during sleep? NO   P (pressure) Do you have or are you being treated for high blood pressure? YES   B (BMI) Is your body index greater than 35 kg/m? NO   A (age) Are you 67 years old or older? YES   N (neck) Do you have a neck circumference greater than 16 inches?   NO   G (gender) Are you a male? YES   TOTAL STOP/BANG "YES" ANSWERS 3       A STOP-Bang score of 2 or less is considered low risk, and a score of 5 or more is high risk for having either moderate or severe OSA. For people who score 3 or 4, doctors may need to perform further assessment to determine how likely they are to have OSA.         EPWORTH SLEEPINESS SCALE:  Scale:  (0)= no chance of dozing; (1)= slight chance of dozing; (2)= moderate chance of dozing; (3)= high chance of dozing  Chance  Situtation    Sitting and reading: 0    Watching TV: 0    Sitting Inactive in public: 0    As a passenger in car: 1      Lying down to rest: 3    Sitting and talking: 0    Sitting  quielty after lunch: 0    In a car, stopped in traffic: 0   TOTAL SCORE:   4 out of 24    SLEEP STUDIES:  PSG (11/06/14) AHI 70, min SPO2 80% PSG (05/16/11) AHI 61, min SPO2 78%   CPAP COMPLIANCE DATA:  Date Range: 01/28/21-01/27/22  Average Daily Use: 11 hours 57 minutes  Median Use: 12 hours  Compliance for > 4 Hours: 365 days  AHI: 0.1 respiratory events per hour  Days Used: 365/365  Mask Leak: 59.5  95th Percentile Pressure: 14 cmh20         LABS: No results found for this or any previous visit (from the past 2160 hour(s)).  Radiology: No results found.  No results found.  No results found.    Assessment and Plan: Patient Active Problem List   Diagnosis Date Noted   Obesity, morbid (HCC) 02/02/2021   Hypertension 02/02/2021   Anxiety and depression 02/02/2021   HLD (hyperlipidemia) 02/02/2021   HTN (hypertension) 02/04/2020   CPAP use counseling 02/04/2020   OSA on CPAP 10/29/2019   Gastroesophageal reflux disease without esophagitis 09/18/2019   S/P laparoscopic colectomy 09/25/2018   History of appendicitis    Acute appendicitis 06/26/2018   Type II diabetes mellitus with manifestations (HCC) 03/29/2018   Hypomagnesemia 08/06/2017   Hypocalcemia 08/05/2017   Hypokalemia 08/04/2017   1. OSA on CPAP The patient does tolerate PAP and reports  benefit from PAP use. The patient was reminded how to clean equipment and advised to replace supplies routinely. The patient was also counselled on weight loss. The compliance is excellent. The AHI is 0.1.   OSA on cpap- controlled. Continue with excellent compliance with pap. CPAP continues to be medically necessary to treat this patient's OSA. F/u one year.     2. CPAP use counseling CPAP Counseling: had a lengthy discussion with the patient regarding the importance of PAP therapy in management of the sleep apnea. Patient appears to understand the risk factor reduction and also understands the  risks associated with untreated sleep apnea. Patient will try to make a good faith effort to remain compliant with therapy. Also instructed the patient on proper cleaning of the device including the water must be changed daily if possible and use of distilled water is preferred. Patient understands that the machine should be regularly cleaned with appropriate recommended cleaning solutions that do not damage the PAP machine for example given white vinegar and water rinses. Other methods such as ozone treatment may not be as good as these simple methods to achieve cleaning.   3. Obesity, morbid (HCC) Obesity Counseling: Had a lengthy discussion regarding patients BMI and weight issues. Patient was instructed on portion control as well as increased activity. Also discussed caloric restrictions with trying to maintain intake less than 2000 Kcal. Discussions were made in accordance with the 5As of weight management. Simple actions such as not eating late and if able to, taking a walk is suggested.   4. Hypertension, unspecified type Hypertension Counseling:   The following hypertensive lifestyle modification were recommended and discussed:  1. Limiting alcohol intake to less than 1 oz/day of ethanol:(24 oz of beer or 8 oz of wine or 2 oz of 100-proof whiskey). 2. Take baby ASA 81 mg daily. 3. Importance of regular aerobic exercise and losing weight. 4. Reduce dietary saturated fat and cholesterol intake for overall cardiovascular health. 5. Maintaining adequate dietary potassium, calcium, and magnesium intake. 6. Regular monitoring of the blood pressure. 7. Reduce sodium intake to less than 100 mmol/day (less than 2.3 gm of sodium or less than 6 gm of sodium choride)      General Counseling: I have discussed the findings of the evaluation and examination with Chrissie NoaWilliam.  I have also discussed any further diagnostic evaluation thatmay be needed or ordered today. Amaar verbalizes understanding of the  findings of todays visit. We also reviewed his medications today and discussed drug interactions and side effects including but not limited excessive drowsiness and altered mental states. We also discussed that there is always a risk not just to him but also people around him. he has been encouraged to call the office with any questions or concerns that should arise related to todays visit.  No orders of the defined types were placed in this encounter.       I have personally obtained a history, examined the patient, evaluated laboratory and imaging results, formulated the assessment and plan and placed orders. This patient was seen today by Emmaline KluverSarah Terrell, PA-C in collaboration with Dr. Freda MunroSaadat Seger Jani.   Yevonne PaxSaadat A Ekin Pilar, MD [facility] Diplomate ABMS Pulmonary Critical Care Medicine and Sleep Medicine

## 2022-02-01 NOTE — Patient Instructions (Signed)

## 2023-02-04 NOTE — Progress Notes (Signed)
[facility] [address] [place], [place] [address]  Pulmonary Sleep Medicine   Office Visit Note  Patient Name: [name] [name]. DOB: May 24, 1970 MRN [id number]    Chief Complaint: Obstructive Sleep Apnea visit  Brief History:  Cabe is seen today for an annual follow up visit for CPAP@ 14 cmH2O. The patient has a 11 year history of sleep apnea. Patient is using PAP nightly.  The patient feels rested after sleeping with PAP.  The patient reports benefiting from PAP use. Reported sleepiness is improved and the Epworth Sleepiness Score is 4 out of 24. The patient does take naps everyday. The patient complains of the following: none.  The compliance download shows 100% compliance with an average use time of 11 hours 39 minutes. The AHI is 0.1.  The patient does not complain of limb movements disrupting sleep. The patient continues to require PAP therapy in order to eliminate sleep apnea.   ROS  General: (-) fever, (-) chills, (-) night sweat Nose and Sinuses: (-) nasal stuffiness or itchiness, (-) postnasal drip, (-) nosebleeds, (-) sinus trouble. Mouth and Throat: (-) sore throat, (-) hoarseness. Neck: (-) swollen glands, (-) enlarged thyroid, (-) neck pain. Respiratory: + cough, - shortness of breath, - wheezing. Neurologic: - numbness, - tingling. Psychiatric: + anxiety, + depression   Current Medication: Outpatient Encounter Medications as of 02/07/2023  Medication Sig   lamoTRIgine (LAMICTAL) 200 MG tablet Take by mouth.   potassium chloride (MICRO-K) 10 MEQ CR capsule Take by mouth.   traZODone (DESYREL) 150 MG tablet Take by mouth.   albuterol (PROVENTIL HFA;VENTOLIN HFA) 108 (90 Base) MCG/ACT inhaler Inhale 2 puffs into the lungs every 6 (six) hours as needed for wheezing or shortness of breath.   atorvastatin (LIPITOR) 80 MG tablet Take 1 tablet by mouth daily.   calcium-vitamin D (OSCAL 500/200 D-3) 500-200 MG-UNIT tablet Take 1 tablet by mouth 2 (two)  times daily. (Patient taking differently: Take 1 tablet by mouth at bedtime. )   cetirizine (ZYRTEC) 10 MG tablet Take 10 mg by mouth at bedtime.    clonazePAM (KLONOPIN) 0.5 MG tablet Take by mouth.   empagliflozin (JARDIANCE) 25 MG TABS tablet TAKE 1 TABLET (25 MG TOTAL) BY MOUTH DAILY WITH BREAKFAST   fenofibrate (TRICOR) 145 MG tablet Take 145 mg by mouth daily.   fexofenadine (ALLEGRA) 180 MG tablet Take by mouth.   glimepiride (AMARYL) 4 MG tablet Take 4 mg by mouth daily with breakfast.   HUMIRA PEN-CD/UC/HS STARTER 80 MG/0.8ML PNKT Inject into the skin.   hydrOXYzine (VISTARIL) 50 MG capsule Take 50 mg by mouth daily as needed.   losartan (COZAAR) 50 MG tablet Take 1 tablet by mouth daily.   magnesium oxide (MAG-OX) 400 MG tablet Take 1 tablet by mouth daily.   Metoprolol Tartrate 75 MG TABS Take 112.5 mg by mouth daily.   NIFEdipine (PROCARDIA-XL/NIFEDICAL-XL) 30 MG 24 hr tablet TAKE 1 TABLET BY MOUTH EVERYDAY AT BEDTIME   nystatin (NYSTATIN) powder Apply 1 g topically 2 (two) times a day.   omeprazole (PRILOSEC) 40 MG capsule Take by mouth.   propranolol (INDERAL) 40 MG tablet Take 1 tablet by mouth 2 (two) times daily.   risperiDONE (RISPERDAL) 3 MG tablet Take by mouth.   sitaGLIPtin-metformin (JANUMET) 50-500 MG tablet Take 1 tablet by mouth 2 (two) times daily with a meal.   Vitamin D, Ergocalciferol, (DRISDOL) 50000 units CAPS capsule Take 1 capsule (50,000 Units total) by mouth every 7 (seven)  days. (Patient taking differently: Take 50,000 Units by mouth every Friday. )   [DISCONTINUED] LamoTRIgine 250 MG TB24 24 hour tablet Take by mouth.   No facility-administered encounter medications on file as of 02/07/2023.    Surgical History: Past Surgical History:  Procedure Laterality Date   COLONOSCOPY WITH PROPOFOL N/A 09/12/2018   Procedure: COLONOSCOPY WITH PROPOFOL;  Surgeon: Toney Reil, MD;  Location: [facility] ENDOSCOPY;  Service: Gastroenterology;  Laterality: N/A;    Gum Graft     LAPAROSCOPIC APPENDECTOMY N/A 09/25/2018   Procedure: APPENDECTOMY LAPAROSCOPIC;  Surgeon: Leafy Ro, MD;  Location: [facility] ORS;  Service: General;  Laterality: N/A;   LAPAROSCOPIC RIGHT COLECTOMY Right 09/25/2018   Procedure: LAPAROSCOPIC RIGHT COLECTOMY;  Surgeon: Leafy Ro, MD;  Location: [facility] ORS;  Service: General;  Laterality: Right;    Medical History: Past Medical History:  Diagnosis Date   Acne vulgaris    Anxiety    Depression    Diabetes mellitus without complication (HCC)    GERD (gastroesophageal reflux disease)    Hyperlipidemia    Hypertension    OSA (obstructive sleep apnea)     Family History: Non contributory to the present illness  Social History: Social History   Socioeconomic History   Marital status: Married    Spouse name: Not on file   Number of children: Not on file   Years of education: Not on file   Highest education level: Not on file  Occupational History   Not on file  Tobacco Use   Smoking status: Never   Smokeless tobacco: Never  Vaping Use   Vaping status: Never Used  Substance and Sexual Activity   Alcohol use: No   Drug use: No   Sexual activity: Not on file  Other Topics Concern   Not on file  Social History Narrative   Lives at home with family   Ambulates independently   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 rate 16, height 6\' 1"  (1.854 m), weight 235 lb (106.6 kg), SpO2 96%. Body mass index is 31 kg/m.    Examination: General Appearance: The patient is well-developed, well-nourished, and in no distress. Neck Circumference: 49 cm Skin: Gross inspection of skin unremarkable. Head:  normocephalic, no gross deformities. Eyes: no gross deformities noted. ENT: ears appear grossly normal Neurologic: Alert and oriented. No involuntary movements.  STOP BANG RISK ASSESSMENT S (snore) Have you been told that you snore?     NO   T (tired) Are you often tired, fatigued, or sleepy during the day?   NO  O (obstruction) Do you stop breathing, choke, or gasp during sleep? NO   P (pressure) Do you have or are you being treated for high blood pressure? YES   B (BMI) Is your body index greater than 35 kg/m? NO   A (age) Are you 21 years old or older? YES   N (neck) Do you have a neck circumference greater than 16 inches?   NO   G (gender) Are you a male? YES   TOTAL STOP/BANG "YES" ANSWERS 3       A STOP-Bang score of 2 or less  is considered low risk, and a score of 5 or more is high risk for having either moderate or severe OSA. For people who score 3 or 4, doctors may need to perform further assessment to determine how likely they are to have OSA.         EPWORTH SLEEPINESS SCALE:  Scale:  (0)= no chance of dozing; (1)= slight chance of dozing; (2)= moderate chance of dozing; (3)= high chance of dozing  Chance  Situtation    Sitting and reading: 0    Watching TV: 0    Sitting Inactive in public: 0    As a passenger in car: 1      Lying down to rest: 3    Sitting and talking: 0    Sitting quielty after lunch: 0    In a car, stopped in traffic: 0   TOTAL SCORE:   4 out of 24    SLEEP STUDIES:  Titration (10/2014 at [facility]) CPAP@ 14 cmH2O PSG (10/2014) AHI 70/hr, min SpO2 80% PSG (04/2011) AHI 61/hr, min SpO2 78%   CPAP COMPLIANCE DATA:  Date Range: 02/04/2022-02/03/2023  Average Daily Use: 11 hours 39 minutes  Median Use: 11 hours 37 minutes  Compliance for > 4 Hours: 100%  AHI: 0.1 respiratory events per hour  Days Used: 365/365 days  Mask Leak: 63.6  95th Percentile Pressure: 14         LABS: No results found for this  or any previous visit (from the past 2160 hour(s)).  Radiology: No results found.  No results found.  No results found.    Assessment and Plan: Patient Active Problem List   Diagnosis Date Noted   Obesity, morbid (HCC) 02/02/2021   Hypertension 02/02/2021   Anxiety and depression 02/02/2021   HLD (hyperlipidemia) 02/02/2021   HTN (hypertension) 02/04/2020   CPAP use counseling 02/04/2020   OSA on CPAP 10/29/2019   Gastroesophageal reflux disease without esophagitis 09/18/2019   S/P laparoscopic colectomy 09/25/2018   History of appendicitis    Acute appendicitis 06/26/2018   Type II diabetes mellitus with manifestations (HCC) 03/29/2018   Hypomagnesemia 08/06/2017   Hypocalcemia 08/05/2017   Hypokalemia 08/04/2017    1. OSA on CPAP The patient does tolerate PAP and reports  benefit from PAP use. The patient was reminded how to clean equipment and advised to replace supplies  more frequently than he has been doing. The patient was also counselled on weight loss. The compliance is excellent. The AHI is 0.1.   OSA on cpap- controlled. Continue with excellent compliance with pap. CPAP continues to be medically necessary to treat this patient's OSA. F/u one year.    2. CPAP use counseling CPAP Counseling: had a lengthy discussion with the patient regarding the importance of PAP therapy in management of the sleep apnea. Patient appears to understand the risk factor reduction and also understands the risks associated with untreated sleep apnea. Patient will try to make a good faith effort to remain compliant with therapy. Also instructed the patient on proper cleaning of the device including the water must be changed daily if possible and use of distilled water is preferred. Patient understands that the machine should be regularly cleaned with appropriate recommended cleaning solutions that do not damage the PAP machine for example given white vinegar and water rinses. Other methods  such as ozone treatment may not be as good as these simple methods to achieve cleaning.   3. Hypertension, unspecified type Controlled. Continue  losartan, metoprolol.  General Counseling: I have discussed the findings of the evaluation and examination with Chrissie Noa.  I have also discussed any further diagnostic evaluation thatmay be needed or ordered today. Arth verbalizes understanding of the findings of todays visit. We also reviewed his medications today and discussed drug interactions and side effects including but not limited excessive drowsiness and altered mental states. We also discussed that there is always a risk not just to him but also people around him. he has been encouraged to call the office with any questions or concerns that should arise related to todays visit.  No orders of the defined types were placed in this encounter.       I have personally obtained a history, examined the patient, evaluated laboratory and imaging results, formulated the assessment and plan and placed orders. This patient was seen today by Emmaline Kluver, PA-C in collaboration with Dr. Freda Munro.   Yevonne Pax, MD [facility] Diplomate ABMS Pulmonary Critical Care Medicine and Sleep Medicine

## 2023-02-07 ENCOUNTER — Ambulatory Visit (INDEPENDENT_AMBULATORY_CARE_PROVIDER_SITE_OTHER): Payer: Medicare Other | Admitting: Internal Medicine

## 2023-02-07 VITALS — BP 125/86 | HR 76 | Resp 16 | Ht 73.0 in | Wt 235.0 lb

## 2023-02-07 DIAGNOSIS — Z7189 Other specified counseling: Secondary | ICD-10-CM

## 2023-02-07 DIAGNOSIS — G4733 Obstructive sleep apnea (adult) (pediatric): Secondary | ICD-10-CM

## 2023-02-07 DIAGNOSIS — I10 Essential (primary) hypertension: Secondary | ICD-10-CM

## 2023-02-07 NOTE — Patient Instructions (Signed)

## 2024-02-12 NOTE — Progress Notes (Unsigned)
 [facility] [address] [place], [place] [address]  Pulmonary Sleep Medicine   Office Visit Note  Patient Name: [name] [name]. DOB: 24-Sep-1970 MRN [id number]    Chief Complaint: Obstructive Sleep Apnea visit  Brief History:  [name] [name] is seen today for an annual follow up visit for CPAP@ 14 cmH2O. The patient has a 12 year history of sleep apnea. Patient is using PAP nightly.  The patient feels rested after sleeping with PAP.  The patient reports benefiting from PAP use. Reported sleepiness is  improved and the Epworth Sleepiness Score is 2 out of 24. The patient does not take naps. The patient complains of the following: pt is in need of a new tubing.  The compliance download shows 99% compliance with an average use time of 10 hours 19 minutes. The AHI is 0.1.  The patient does not complain of limb movements disrupting sleep. The patient continues to require PAP therapy in order to eliminate sleep apnea.   ROS  General: (-) fever, (-) chills, (-) night sweat Nose and Sinuses: (-) nasal stuffiness or itchiness, (-) postnasal drip, (-) nosebleeds, (-) sinus trouble. Mouth and Throat: (-) sore throat, (-) hoarseness. Neck: (-) swollen glands, (-) enlarged thyroid, (-) neck pain. Respiratory: - cough, - shortness of breath, - wheezing. Neurologic: - numbness, - tingling. Psychiatric: + anxiety, + depression   Current Medication: Outpatient Encounter Medications as of 02/13/2024  Medication Sig   folic acid (FOLVITE) 1 MG tablet Take 1,000 mcg by mouth.   montelukast (SINGULAIR) 10 MG tablet Take 10 mg by mouth at bedtime.   potassium chloride  (MICRO-K ) 10 MEQ CR capsule Take 10 mEq by mouth 2 (two) times daily.   RABEprazole (ACIPHEX) 20 MG tablet Take 20 mg by mouth 2 (two) times daily.   albuterol  (PROVENTIL  HFA;VENTOLIN  HFA) 108 (90 Base) MCG/ACT inhaler Inhale 2 puffs into the lungs every 6 (six) hours as needed for wheezing or shortness of breath.    atorvastatin  (LIPITOR) 80 MG tablet Take 1 tablet by mouth daily.   clonazePAM (KLONOPIN) 0.5 MG tablet Take by mouth.   empagliflozin (JARDIANCE) 25 MG TABS tablet TAKE 1 TABLET (25 MG TOTAL) BY MOUTH DAILY WITH BREAKFAST   fenofibrate (TRICOR) 145 MG tablet Take 145 mg by mouth daily.   glimepiride  (AMARYL ) 4 MG tablet Take 4 mg by mouth daily with breakfast.   HUMIRA PEN-CD/UC/HS STARTER 80 MG/0.8ML PNKT Inject into the skin.   hydrOXYzine (VISTARIL) 50 MG capsule Take 50 mg by mouth daily as needed.   lamoTRIgine (LAMICTAL) 200 MG tablet Take by mouth.   losartan  (COZAAR ) 50 MG tablet Take 1 tablet by mouth daily.   magnesium  oxide (MAG-OX) 400 MG tablet Take 1 tablet by mouth daily.   Metoprolol  Tartrate 75 MG TABS Take 112.5 mg by mouth daily.   NIFEdipine  (PROCARDIA -XL/NIFEDICAL-XL) 30 MG 24 hr tablet TAKE 1 TABLET BY MOUTH EVERYDAY AT BEDTIME   nystatin (NYSTATIN) powder Apply 1 g topically 2 (two) times a day.   omeprazole (PRILOSEC) 40 MG capsule Take by mouth.   propranolol (INDERAL) 40 MG tablet Take 1 tablet by mouth 2 (two) times daily.   risperiDONE (RISPERDAL) 3 MG tablet Take by mouth.   sitaGLIPtin -metformin  (JANUMET ) 50-500 MG tablet Take 1 tablet by mouth 2 (two) times daily with a meal.   traZODone (DESYREL) 150 MG tablet Take by mouth.   Vitamin D , Ergocalciferol , (DRISDOL ) 50000 units CAPS capsule Take 1 capsule (50,000 Units total) by mouth every  7 (seven) days. (Patient taking differently: Take 50,000 Units by mouth every Friday. )   [DISCONTINUED] calcium -vitamin D  (OSCAL 500/200 D-3) 500-200 MG-UNIT tablet Take 1 tablet by mouth 2 (two) times daily. (Patient taking differently: Take 1 tablet by mouth at bedtime. )   [DISCONTINUED] cetirizine (ZYRTEC) 10 MG tablet Take 10 mg by mouth at bedtime.    [DISCONTINUED] fexofenadine (ALLEGRA) 180 MG tablet Take by mouth.   No facility-administered encounter medications on file as of 02/13/2024.    Surgical History: Past  Surgical History:  Procedure Laterality Date   COLONOSCOPY WITH PROPOFOL  N/A 09/12/2018   Procedure: COLONOSCOPY WITH PROPOFOL ;  Surgeon: Unk Corinn Skiff, MD;  Location: [facility] ENDOSCOPY;  Service: Gastroenterology;  Laterality: N/A;   Gum Graft     LAPAROSCOPIC APPENDECTOMY N/A 09/25/2018   Procedure: APPENDECTOMY LAPAROSCOPIC;  Surgeon: Jordis Laneta FALCON, MD;  Location: [facility] ORS;  Service: General;  Laterality: N/A;   LAPAROSCOPIC RIGHT COLECTOMY Right 09/25/2018   Procedure: LAPAROSCOPIC RIGHT COLECTOMY;  Surgeon: Jordis Laneta FALCON, MD;  Location: [facility] ORS;  Service: General;  Laterality: Right;    Medical History: Past Medical History:  Diagnosis Date   Acne vulgaris    Anxiety    Depression    Diabetes mellitus without complication (HCC)    GERD (gastroesophageal reflux disease)    Hyperlipidemia    Hypertension    OSA (obstructive sleep apnea)     Family History: Non contributory to the present illness  Social History: Social History   Socioeconomic History   Marital status: Married    Spouse name: Not on file   Number of children: Not on file   Years of education: Not on file   Highest education level: Not on file  Occupational History   Not on file  Tobacco Use   Smoking status: Never   Smokeless tobacco: Never  Vaping Use   Vaping status: Never Used  Substance and Sexual Activity   Alcohol use: No   Drug use: No   Sexual activity: Not on file  Other Topics Concern   Not on file  Social History Narrative   Lives at home with family   Ambulates independently   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   Social Drivers of Health   Tobacco Use: Low Risk (02/13/2024)   Patient History    Smoking Tobacco Use: Never    Smokeless Tobacco Use: Never    Passive Exposure: Not on file  Financial Resource Strain: Low Risk  (02/02/2024)   Received from [facility]   Overall Financial Resource Strain (CARDIA)    Difficulty of Paying Living Expenses: Not hard at all   Food Insecurity: No Food Insecurity (02/02/2024)   Received from [facility]   Epic    Within the past 12 months, you worried that your food would run out before you got the money to buy more.: Never true    Within the past 12 months, the food you bought just didn't last and you didn't have money to get more.: Never true  Transportation Needs: No Transportation Needs (02/02/2024)   Received from [facility] - Transportation    In the past 12 months, has lack of transportation kept you from medical appointments or from getting medications?: No    Lack of Transportation (Non-Medical): No  Physical Activity: Not on file  Stress: Not on file  Social Connections: Not on file  Intimate Partner Violence: Not on file  Depression (PHQ2-9): Not on file  Alcohol Screen: Not on file  Housing: Low Risk  (02/02/2024)   Received from [facility]   Epic    At any time in the past 12 months, were you homeless or living in a shelter (including now)?: No    In the past 12 months, how many times have you moved where you were living?: 0    In the last 12 months, was there a time when you were not able to pay the mortgage or rent on time?: No  Utilities: Not At Risk (02/02/2024)   Received from [facility]   Epic    In the past 12 months has the electric, gas, oil, or water company threatened to shut off services in your home?: No  Health Literacy: Not on file    Vital Signs: Blood pressure 128/78, pulse 65, resp. rate 16, height 6' 1 (1.854 m), weight 234 lb (106.1 kg), SpO2 97%. Body mass index is 30.87 kg/m.    Examination: General Appearance: The patient is well-developed, well-nourished, and in no distress. Neck Circumference: 49 cm Skin: Gross inspection of skin unremarkable. Head: normocephalic, no gross deformities. Eyes: no gross deformities noted. ENT: ears appear grossly normal Neurologic: Alert and  oriented. No involuntary movements.  STOP BANG RISK ASSESSMENT S (snore) Have you been told that you snore?     NO   T (tired) Are you often tired, fatigued, or sleepy during the day?   NO  O (obstruction) Do you stop breathing, choke, or gasp during sleep? NO   P (pressure) Do you have or are you being treated for high blood pressure? YES   B (BMI) Is your body index greater than 35 kg/m? NO   A (age) Are you 2 years old or older? YES   N (neck) Do you have a neck circumference greater than 16 inches?   YES   G (gender) Are you a male? YES   TOTAL STOP/BANG YES ANSWERS 4       A STOP-Bang score of 2 or less is considered low risk, and a score of 5 or more is high risk for having either moderate or severe OSA. For people who score 3 or 4, doctors may need to perform further assessment to determine how likely they are to have OSA.         EPWORTH SLEEPINESS SCALE:  Scale:  (0)= no chance of dozing; (1)= slight chance of dozing; (2)= moderate chance of dozing; (3)= high chance of dozing  Chance  Situtation    Sitting and reading: 0    Watching TV: 0    Sitting Inactive in public: 0    As a passenger in car: 1      Lying down to rest: 1    Sitting and talking: 0    Sitting quielty after lunch: 0    In a car, stopped in traffic: 0   TOTAL SCORE:   2 out of 24    SLEEP STUDIES:  Titration (10/2014 at [facility]) CPAP@ 14 cmH2O PSG (10/2014) AHI 70/hr, min SpO2 80% PSG (30/2013) AHI 61/hr, min SpO278%   CPAP COMPLIANCE DATA:  Date Range: 02/10/2023-02/09/2024  Average Daily Use: 10 hours 19 minutes  Median Use: 10 hours 25 minutes  Compliance for > 4 Hours: 99%  AHI: 0.1 respiratory events per hour  Days Used: 364/365 days  Mask Leak: 53.2  95th Percentile Pressure: 14  LABS: No results found for this or any previous visit (from the past 2160 hours).  Radiology: No results found.  No results found.  No results  found.    Assessment and Plan: Patient Active Problem List   Diagnosis Date Noted   Obesity, morbid (HCC) 02/02/2021   Hypertension 02/02/2021   Anxiety and depression 02/02/2021   HLD (hyperlipidemia) 02/02/2021   HTN (hypertension) 02/04/2020   CPAP use counseling 02/04/2020   OSA on CPAP 10/29/2019   Gastroesophageal reflux disease without esophagitis 09/18/2019   S/P laparoscopic colectomy 09/25/2018   History of appendicitis    Acute appendicitis 06/26/2018   Type II diabetes mellitus with manifestations (HCC) 03/29/2018   Hypomagnesemia 08/06/2017   Hypocalcemia 08/05/2017   Hypokalemia 08/04/2017   1. OSA on CPAP (Primary) The patient does tolerate PAP and reports  benefit from PAP use. The patient was reminded how to clean equipment and advised to replace supplies routinely. The patient was also counselled on weight loss. The compliance is excellent. The AHI is 0.1.   OSA on cpap- controlled. Continue with excellent compliance with pap. CPAP continues to be medically necessary to treat this patient's OSA. F/u one year.    2. CPAP use counseling CPAP Counseling: had a lengthy discussion with the patient regarding the importance of PAP therapy in management of the sleep apnea. Patient appears to understand the risk factor reduction and also understands the risks associated with untreated sleep apnea. Patient will try to make a good faith effort to remain compliant with therapy. Also instructed the patient on proper cleaning of the device including the water must be changed daily if possible and use of distilled water is preferred. Patient understands that the machine should be regularly cleaned with appropriate recommended cleaning solutions that do not damage the PAP machine for example given white vinegar and water rinses. Other methods such as ozone treatment may not be as good as these simple methods to achieve cleaning.   3. Hypertension, unspecified type Controlled with  losartan , continue.      General Counseling: I have discussed the findings of the evaluation and examination with Elsie.  I have also discussed any further diagnostic evaluation thatmay be needed or ordered today. Esli verbalizes understanding of the findings of todays visit. We also reviewed his medications today and discussed drug interactions and side effects including but not limited excessive drowsiness and altered mental states. We also discussed that there is always a risk not just to him but also people around him. he has been encouraged to call the office with any questions or concerns that should arise related to todays visit.  No orders of the defined types were placed in this encounter.       I have personally obtained a history, examined the patient, evaluated laboratory and imaging results, formulated the assessment and plan and placed orders. This patient was seen today by Lauraine Lay, PA-C in collaboration with Dr. Elfreda Bathe.   Elfreda DELENA Bathe, MD [facility] Diplomate ABMS Pulmonary Critical Care Medicine and Sleep Medicine

## 2024-02-13 ENCOUNTER — Ambulatory Visit: Admitting: Internal Medicine

## 2024-02-13 VITALS — BP 128/78 | HR 65 | Resp 16 | Ht 73.0 in | Wt 234.0 lb

## 2024-02-13 DIAGNOSIS — G4733 Obstructive sleep apnea (adult) (pediatric): Secondary | ICD-10-CM

## 2024-02-13 DIAGNOSIS — I10 Essential (primary) hypertension: Secondary | ICD-10-CM

## 2024-02-13 DIAGNOSIS — Z7189 Other specified counseling: Secondary | ICD-10-CM

## 2024-02-13 NOTE — Patient Instructions (Signed)

## 2024-03-20 ENCOUNTER — Encounter: Payer: Self-pay | Admitting: Gastroenterology

## 2024-03-20 ENCOUNTER — Ambulatory Visit: Admitting: Anesthesiology

## 2024-03-20 ENCOUNTER — Encounter
Admission: RE | Disposition: A | Discharge status: Home / Self Care | Payer: Self-pay | Source: Home / Self Care | Attending: Gastroenterology

## 2024-03-20 ENCOUNTER — Ambulatory Visit: Admission: RE | Admit: 2024-03-20 | Admitting: Gastroenterology

## 2024-03-20 ENCOUNTER — Other Ambulatory Visit: Payer: Self-pay

## 2024-03-20 DIAGNOSIS — E119 Type 2 diabetes mellitus without complications: Secondary | ICD-10-CM | POA: Diagnosis not present

## 2024-03-20 DIAGNOSIS — F32A Depression, unspecified: Secondary | ICD-10-CM | POA: Diagnosis not present

## 2024-03-20 DIAGNOSIS — D124 Benign neoplasm of descending colon: Secondary | ICD-10-CM | POA: Insufficient documentation

## 2024-03-20 DIAGNOSIS — Z1211 Encounter for screening for malignant neoplasm of colon: Secondary | ICD-10-CM | POA: Insufficient documentation

## 2024-03-20 DIAGNOSIS — Z6830 Body mass index (BMI) 30.0-30.9, adult: Secondary | ICD-10-CM | POA: Insufficient documentation

## 2024-03-20 DIAGNOSIS — F419 Anxiety disorder, unspecified: Secondary | ICD-10-CM | POA: Insufficient documentation

## 2024-03-20 DIAGNOSIS — Z980 Intestinal bypass and anastomosis status: Secondary | ICD-10-CM | POA: Insufficient documentation

## 2024-03-20 DIAGNOSIS — I10 Essential (primary) hypertension: Secondary | ICD-10-CM | POA: Diagnosis not present

## 2024-03-20 DIAGNOSIS — E66813 Obesity, class 3: Secondary | ICD-10-CM | POA: Insufficient documentation

## 2024-03-20 DIAGNOSIS — G4733 Obstructive sleep apnea (adult) (pediatric): Secondary | ICD-10-CM | POA: Insufficient documentation

## 2024-03-20 HISTORY — PX: COLONOSCOPY: SHX5424

## 2024-03-20 HISTORY — PX: POLYPECTOMY: SHX149

## 2024-03-20 LAB — GLUCOSE, CAPILLARY: Glucose-Capillary: 146 mg/dL — ABNORMAL HIGH (ref 70–99)

## 2024-03-20 MED ORDER — LIDOCAINE HCL (CARDIAC) PF 100 MG/5ML IV SOSY
PREFILLED_SYRINGE | INTRAVENOUS | Status: DC | PRN
  Administered 2024-03-20: 50 mg via INTRAVENOUS

## 2024-03-20 MED ORDER — LIDOCAINE HCL (PF) 2 % IJ SOLN
INTRAMUSCULAR | Status: AC
  Filled 2024-03-20: qty 5

## 2024-03-20 MED ORDER — SODIUM CHLORIDE 0.9 % IV SOLN: INTRAVENOUS | Status: DC

## 2024-03-20 MED ORDER — PROPOFOL 10 MG/ML IV BOLUS
INTRAVENOUS | Status: DC | PRN
  Administered 2024-03-20: 100 mg via INTRAVENOUS

## 2024-03-20 MED ORDER — PROPOFOL 10 MG/ML IV BOLUS
INTRAVENOUS | Status: AC
  Filled 2024-03-20: qty 40

## 2024-03-20 MED ORDER — PROPOFOL 500 MG/50ML IV EMUL
INTRAVENOUS | Status: DC | PRN
  Administered 2024-03-20: 130 ug/kg/min via INTRAVENOUS

## 2024-03-20 NOTE — Anesthesia Preprocedure Evaluation (Signed)
"                                    Anesthesia Evaluation  Patient identified by MRN, date of birth, ID band Patient awake    Reviewed: Allergy & Precautions, H&P , NPO status , Patient's Chart, lab work & pertinent test results, reviewed documented beta blocker date and time   Airway Mallampati: II   Neck ROM: full    Dental  (+) Poor Dentition   Pulmonary sleep apnea    Pulmonary exam normal        Cardiovascular Exercise Tolerance: Poor hypertension, On Medications negative cardio ROS Normal cardiovascular exam Rhythm:regular Rate:Normal     Neuro/Psych   Anxiety Depression    negative neurological ROS  negative psych ROS   GI/Hepatic Neg liver ROS,GERD  Medicated,,  Endo/Other  diabetes, Well Controlled  Class 3 obesity  Renal/GU negative Renal ROS  negative genitourinary   Musculoskeletal   Abdominal   Peds  Hematology negative hematology ROS (+)   Anesthesia Other Findings Past Medical History: No date: Acne vulgaris No date: Anxiety No date: Depression No date: Diabetes mellitus without complication (HCC) No date: GERD (gastroesophageal reflux disease) No date: Hyperlipidemia No date: Hypertension No date: OSA (obstructive sleep apnea) Past Surgical History: 09/12/2018: COLONOSCOPY WITH PROPOFOL ; N/A     Comment:  Procedure: COLONOSCOPY WITH PROPOFOL ;  Surgeon: Unk Corinn Skiff, MD;  Location: [facility] ENDOSCOPY;  Service:               Gastroenterology;  Laterality: N/A; No date: Gum Graft 09/25/2018: LAPAROSCOPIC APPENDECTOMY; N/A     Comment:  Procedure: APPENDECTOMY LAPAROSCOPIC;  Surgeon: Jordis Laneta FALCON, MD;  Location: [facility] ORS;  Service: General;                Laterality: N/A; 09/25/2018: LAPAROSCOPIC RIGHT COLECTOMY; Right     Comment:  Procedure: LAPAROSCOPIC RIGHT COLECTOMY;  Surgeon:               Jordis Laneta FALCON, MD;  Location: [facility] ORS;  Service:               General;  Laterality: Right;    Reproductive/Obstetrics negative OB ROS                              Anesthesia Physical Anesthesia Plan  ASA: 3  Anesthesia Plan: General   Post-op Pain Management:    Induction:   PONV Risk Score and Plan:   Airway Management Planned:   Additional Equipment:   Intra-op Plan:   Post-operative Plan:   Informed Consent: I have reviewed the patients History and Physical, chart, labs and discussed the procedure including the risks, benefits and alternatives for the proposed anesthesia with the patient or authorized representative who has indicated his/her understanding and acceptance.     Dental Advisory Given  Plan Discussed with: CRNA  Anesthesia Plan Comments:         Anesthesia Quick Evaluation  "

## 2024-03-20 NOTE — H&P (Signed)
 "   [name], MD [facility], [facility] [address]  [place], [place] [address]  Main: [phone] Fax:  [phone] Pager: [phone]   Primary Care Physician:  Auston Reyes BIRCH, MD Primary Gastroenterologist:  Dr. [name]  Pre-Procedure History & Physical: HPI:  [name] [name]. is a 54 y.o. male is here for an colonoscopy.   Past Medical History:  Diagnosis Date   Acne vulgaris    Anxiety    Depression    Diabetes mellitus without complication (HCC)    GERD (gastroesophageal reflux disease)    Hyperlipidemia    Hypertension    OSA (obstructive sleep apnea)     Past Surgical History:  Procedure Laterality Date   COLONOSCOPY WITH PROPOFOL  N/A 09/12/2018   Procedure: COLONOSCOPY WITH PROPOFOL ;  Surgeon: Brooklyn Corinn Skiff, MD;  Location: [facility] ENDOSCOPY;  Service: Gastroenterology;  Laterality: N/A;   Gum Graft     LAPAROSCOPIC APPENDECTOMY N/A 09/25/2018   Procedure: APPENDECTOMY LAPAROSCOPIC;  Surgeon: Jordis Laneta FALCON, MD;  Location: [facility] ORS;  Service: General;  Laterality: N/A;   LAPAROSCOPIC RIGHT COLECTOMY Right 09/25/2018   Procedure: LAPAROSCOPIC RIGHT COLECTOMY;  Surgeon: Jordis Laneta FALCON, MD;  Location: [facility] ORS;  Service: General;  Laterality: Right;    Prior to Admission medications  Medication Sig Start Date End Date Taking? Authorizing Provider  atorvastatin  (LIPITOR) 80 MG tablet Take 1 tablet by mouth daily. 01/27/20  Yes [provider]  clonazePAM (KLONOPIN) 0.5 MG tablet Take by mouth. 03/27/20  Yes [provider]  famotidine (PEPCID) 20 MG tablet Take 20 mg by mouth 2 (two) times daily.   Yes [provider]  fenofibrate (TRICOR) 145 MG tablet Take 145 mg by mouth daily.   Yes [provider]  folic acid (FOLVITE) 1 MG tablet Take 1,000 mcg by mouth. 03/18/22  Yes [provider]  HUMIRA PEN-CD/UC/HS STARTER 80 MG/0.8ML PNKT Inject into the skin. 11/02/19  Yes [provider]  lamoTRIgine (LAMICTAL) 200 MG tablet Take by mouth. 01/17/23  Yes [provider]  losartan  (COZAAR ) 50 MG tablet Take 1 tablet by mouth daily. 04/30/20  Yes [provider]  magnesium  oxide (MAG-OX) 400 MG tablet Take 1 tablet by mouth daily. 06/24/19  Yes [provider]  montelukast (SINGULAIR) 10 MG tablet Take 10 mg by mouth at bedtime. 12/08/23 12/07/24 Yes [provider]  potassium chloride  (MICRO-K ) 10 MEQ CR capsule Take 10 mEq by mouth 2 (two) times daily. 07/19/23  Yes [provider]  RABEprazole (ACIPHEX) 20 MG tablet Take 20 mg by mouth 2 (two) times daily. 09/06/23 09/05/24 Yes [provider]  risperiDONE (RISPERDAL) 3 MG tablet Take by mouth. 07/25/19  Yes [provider]  traZODone (DESYREL) 150 MG tablet Take by mouth. 11/18/22  Yes [provider]  albuterol  (PROVENTIL  HFA;VENTOLIN  HFA) 108 (90 Base) MCG/ACT inhaler Inhale 2 puffs into the lungs every 6 (six) hours as needed for wheezing or shortness of breath.    [provider]  empagliflozin (JARDIANCE) 25 MG TABS tablet TAKE 1 TABLET (25 MG TOTAL) BY MOUTH DAILY WITH BREAKFAST 07/06/20   [provider]  glimepiride  (AMARYL ) 4 MG tablet Take 4 mg by mouth daily with breakfast.    [provider]  hydrOXYzine (VISTARIL) 50 MG capsule Take 50 mg by mouth daily as needed.    [provider]  Metoprolol  Tartrate 75 MG TABS Take 112.5 mg by mouth daily.  [provider]  NIFEdipine  (PROCARDIA -XL/NIFEDICAL-XL) 30 MG 24 hr tablet TAKE 1 TABLET BY MOUTH EVERYDAY AT BEDTIME 11/30/20   [provider]  nystatin (NYSTATIN) powder Apply 1 g topically 2 (two) times a day.    [provider]  omeprazole (PRILOSEC) 40 MG capsule Take by mouth. 09/17/20   [provider]  propranolol (INDERAL) 40 MG tablet Take 1 tablet by mouth 2 (two) times daily. 11/07/20 11/07/21  [provider]   sitaGLIPtin -metformin  (JANUMET ) 50-500 MG tablet Take 1 tablet by mouth 2 (two) times daily with a meal.    [provider]  Vitamin D , Ergocalciferol , (DRISDOL ) 50000 units CAPS capsule Take 1 capsule (50,000 Units total) by mouth every 7 (seven) days. Patient taking differently: Take 50,000 Units by mouth every Friday.  08/12/17   Barbette Cea, MD    Allergies as of 03/06/2024 - Review Complete 02/13/2024  Allergen Reaction Noted   Penicillins Anaphylaxis and Other (See Comments) 08/01/2013   Sulfa antibiotics Anaphylaxis 08/01/2013   Sulfasalazine Anaphylaxis 01/22/2015   Azithromycin Nausea And Vomiting and Rash 08/01/2013    Family History  Problem Relation Age of Onset   Colon cancer Mother    CAD Father    Diabetes Sister     Social History   Socioeconomic History   Marital status: Married    Spouse name: Not on file   Number of children: Not on file   Years of education: Not on file   Highest education level: Not on file  Occupational History   Not on file  Tobacco Use   Smoking status: Never   Smokeless tobacco: Never  Vaping Use   Vaping status: Never Used  Substance and Sexual Activity   Alcohol use: No   Drug use: No   Sexual activity: Not on file  Other Topics Concern   Not on file  Social History Narrative   Lives at home with family   Ambulates independently   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   Social Drivers of Health   Tobacco Use: Low Risk (03/20/2024)   Patient History    Smoking Tobacco Use: Never    Smokeless Tobacco Use: Never    Passive Exposure: Not on file  Financial Resource Strain: Low Risk  (03/06/2024)   Received from [facility]   Overall Financial Resource Strain (CARDIA)    Difficulty of Paying Living Expenses: Not hard at all  Food Insecurity: No Food Insecurity (03/06/2024)   Received from [facility]   Epic    Within the past 12 months, you worried that your food would run out before you got  the money to buy more.: Never true    Within the past 12 months, the food you bought just didn't last and you didn't have money to get more.: Never true  Transportation Needs: No Transportation Needs (03/06/2024)   Received from [facility]    In the past 12 months, has lack of transportation kept you from medical appointments or from getting medications?: No    Lack of Transportation (Non-Medical): No  Physical Activity: Not on file  Stress: Not on file  Social Connections: Not on file  Intimate Partner Violence: Not on file  Depression (EYV7-0): Not on file  Alcohol Screen: Not on file  Housing: Low Risk  (03/06/2024)   [facility]   Epic    In the last 12 months, was there  a time when you were not able to pay the mortgage or rent on time?: No    In the past 12 months, how many times have you moved where you were living?: 0    At any time in the past 12 months, were you homeless or living in a shelter (including now)?: No  Utilities: Not At Risk (03/06/2024)   Received from [facility]   Epic    In the past 12 months has the electric, gas, oil, or water company threatened to shut off services in your home?: No  Health Literacy: Not on file    Review of Systems: See HPI, otherwise negative ROS  Physical Exam: BP 135/83   Pulse 61   Temp (!) 96.9 F (36.1 C) (Temporal)   Resp 18   Ht 6' 1 (1.854 m)   Wt 103.2 kg   SpO2 98%   BMI 30.03 kg/m  General:   Alert,  pleasant and cooperative in NAD Head:  Normocephalic and atraumatic. Neck:  Supple; no masses or thyromegaly. Lungs:  Clear throughout to auscultation.    Heart:  Regular rate and rhythm. Abdomen:  Soft, nontender and nondistended. Normal bowel sounds, without guarding, and without rebound.   Neurologic:  Alert and  oriented x4;  grossly normal neurologically.  Impression/Plan: [name] [name]. is here for an  colonoscopy to be performed for colon cancer screening  Risks, benefits, limitations, and alternatives regarding  colonoscopy have been reviewed with the patient.  Questions have been answered.  All parties agreeable.   Corinn Brooklyn, MD  03/20/2024, 9:21 AM "

## 2024-03-20 NOTE — Op Note (Signed)
 [facility] Gastroenterology Patient Name: [name] [name] Procedure Date: 03/20/2024 9:24 AM MRN: [id number] Account #: [id number] Date of Birth: 12/25/1970 Admit Type: Outpatient Age: 54 Room: [facility] ENDO ROOM 3 Gender: Male Note Status: Finalized Instrument Name: Colon Scope [id number] Procedure:             Colonoscopy Indications:           Screening for colorectal malignant neoplasm, Screening                         for colorectal malignant neoplasm, inadequate bowel                         prep on last colonoscopy (more recent than 10 years                         ago), Last colonoscopy: July 2020 Providers:             Corinn Jess Brooklyn MD, MD Referring MD:          Corinn Jess Brooklyn MD, MD (Referring MD), Reyes BIRCH.                         Auston, MD (Referring MD) Medicines:             General Anesthesia Complications:         No immediate complications. Estimated blood loss: None. Procedure:             Pre-Anesthesia Assessment:                        - Prior to the procedure, a History and Physical was                         performed, and patient medications and allergies were                         reviewed. The patient is competent. The risks and                         benefits of the procedure and the sedation options and                         risks were discussed with the patient. All questions                         were answered and informed consent was obtained.                         Patient identification and proposed procedure were                         verified by the physician, the nurse, the                         anesthesiologist, the anesthetist and the technician                         in the pre-procedure area in the procedure room in the  endoscopy suite. Mental Status Examination: alert and                         oriented. Airway Examination: normal oropharyngeal                         airway and  neck mobility. Respiratory Examination:                         clear to auscultation. CV Examination: normal.                         Prophylactic Antibiotics: The patient does not require                         prophylactic antibiotics. Prior Anticoagulants: The                         patient has taken no anticoagulant or antiplatelet                         agents. ASA Grade Assessment: III - A patient with                         severe systemic disease. After reviewing the risks and                         benefits, the patient was deemed in satisfactory                         condition to undergo the procedure. The anesthesia                         plan was to use general anesthesia. Immediately prior                         to administration of medications, the patient was                         re-assessed for adequacy to receive sedatives. The                         heart rate, respiratory rate, oxygen saturations,                         blood pressure, adequacy of pulmonary ventilation, and                         response to care were monitored throughout the                         procedure. The physical status of the patient was                         re-assessed after the procedure.                        After obtaining informed consent, the colonoscope was  passed under direct vision. Throughout the procedure,                         the patient's blood pressure, pulse, and oxygen                         saturations were monitored continuously. The                         Colonoscope was introduced through the anus and                         advanced to the the cecum, identified by appendiceal                         orifice and ileocecal valve. The colonoscopy was                         performed without difficulty. The patient tolerated                         the procedure well. The quality of the bowel                         preparation  was excellent. Findings:      The perianal and digital rectal examinations were normal. Pertinent       negatives include normal sphincter tone and no palpable rectal lesions.      There was evidence of a prior end-to-side ileo-colonic anastomosis in       the cecum. This was patent and was characterized by healthy appearing       mucosa. The anastomosis was traversed.      The terminal ileum appeared normal.      A 6 mm polyp was found in the descending colon. The polyp was sessile.       The polyp was removed with a cold snare. Resection and retrieval were       complete. Estimated blood loss: none.      The retroflexed view of the distal rectum and anal verge was normal and       showed no anal or rectal abnormalities. Impression:            - Patent end-to-side ileo-colonic anastomosis,                         characterized by healthy appearing mucosa.                        - The examined portion of the ileum was normal.                        - One 6 mm polyp in the descending colon, removed with                         a cold snare. Resected and retrieved.                        - The distal rectum and anal verge are normal on  retroflexion view. Recommendation:        - Discharge patient to home (with escort).                        - Resume previous diet today.                        - Continue present medications.                        - Await pathology results.                        - Repeat colonoscopy in 5 years for surveillance. Procedure Code(s):     --- Professional ---                        [phone], Colonoscopy, flexible; with removal of                         tumor(s), polyp(s), or other lesion(s) by snare                         technique Diagnosis Code(s):     --- Professional ---                        Z98.0, Intestinal bypass and anastomosis status                        D12.4, Benign neoplasm of descending colon                         Z12.11, Encounter for screening for malignant neoplasm                         of colon CPT copyright 2022 [employer]. All rights reserved. The codes documented in this report are preliminary and upon coder review may  be revised to meet current compliance requirements. Dr. Corinn Brooklyn Corinn Jess Brooklyn MD, MD 03/20/2024 10:06:46 AM This report has been signed electronically. Number of Addenda: 0 Note Initiated On: 03/20/2024 9:24 AM Scope Withdrawal Time: 0 hours 8 minutes 57 seconds  Total Procedure Duration: 0 hours 10 minutes 41 seconds  Estimated Blood Loss:  Estimated blood loss: none.      [facility]

## 2024-03-20 NOTE — Anesthesia Postprocedure Evaluation (Signed)
"   Anesthesia Post Note  Patient: [name] [name].  Procedure(s) Performed: COLONOSCOPY POLYPECTOMY, INTESTINE  Patient location during evaluation: PACU Anesthesia Type: General Level of consciousness: awake and alert Pain management: pain level controlled Vital Signs Assessment: post-procedure vital signs reviewed and stable Respiratory status: spontaneous breathing, nonlabored ventilation, respiratory function stable and patient connected to nasal cannula oxygen Cardiovascular status: blood pressure returned to baseline and stable Postop Assessment: no apparent nausea or vomiting Anesthetic complications: no   No notable events documented.   Last Vitals:  Vitals:   03/20/24 1017 03/20/24 1027  BP: 108/66 115/64  Pulse: 67 64  Resp: (!) 27 20  Temp:    SpO2: 95% 97%    Last Pain:  Vitals:   03/20/24 1027  TempSrc:   PainSc: 0-No pain                 Lynwood KANDICE Clause      "

## 2024-03-20 NOTE — Transfer of Care (Signed)
 Immediate Anesthesia Transfer of Care Note  Patient: [name] [name].  Procedure(s) Performed: COLONOSCOPY POLYPECTOMY, INTESTINE  Patient Location: PACU and Endoscopy Unit  Anesthesia Type:General  Level of Consciousness: drowsy and patient cooperative  Airway & Oxygen Therapy: Patient Spontanous Breathing  Post-op Assessment: Report given to RN and Post -op Vital signs reviewed and stable  Post vital signs: Reviewed and stable  Last Vitals:  Vitals Value Taken Time  BP 91/50 03/20/24 10:08  Temp 35.9 C 03/20/24 10:08  Pulse 67 03/20/24 10:16  Resp 27 03/20/24 10:16  SpO2 95 % 03/20/24 10:16  Vitals shown include unfiled device data.  Last Pain:  Vitals:   03/20/24 1008  TempSrc: Temporal  PainSc: 0-No pain         Complications: No notable events documented.

## 2024-03-21 LAB — SURGICAL PATHOLOGY

## 2024-03-22 ENCOUNTER — Ambulatory Visit: Payer: Self-pay | Admitting: Gastroenterology
